# Patient Record
Sex: Male | Born: 1935 | Hispanic: No | Marital: Married | Smoking: Never smoker
Health system: Southern US, Community
[De-identification: ages and names within clinical notes are randomized; demographics above are authoritative.]

## PROBLEM LIST (undated history)

## (undated) DIAGNOSIS — I480 Paroxysmal atrial fibrillation: Secondary | ICD-10-CM

## (undated) DIAGNOSIS — G4700 Insomnia, unspecified: Secondary | ICD-10-CM

## (undated) DIAGNOSIS — I10 Essential (primary) hypertension: Secondary | ICD-10-CM

## (undated) DIAGNOSIS — F0390 Unspecified dementia without behavioral disturbance: Secondary | ICD-10-CM

## (undated) HISTORY — PX: NO PAST SURGERIES: SHX2092

---

## 2017-11-07 ENCOUNTER — Other Ambulatory Visit
Admission: RE | Admit: 2017-11-07 | Discharge: 2017-11-07 | Disposition: A | Discharge status: Home / Self Care | Payer: Medicare Other | Source: Other Acute Inpatient Hospital | Attending: Family Medicine | Admitting: Family Medicine

## 2017-11-07 DIAGNOSIS — R52 Pain, unspecified: Secondary | ICD-10-CM | POA: Diagnosis present

## 2017-11-07 LAB — URINALYSIS, COMPLETE (UACMP) WITH MICROSCOPIC
Bilirubin Urine: NEGATIVE
GLUCOSE, UA: NEGATIVE mg/dL
Hgb urine dipstick: NEGATIVE
KETONES UR: NEGATIVE mg/dL
NITRITE: POSITIVE — AB
PROTEIN: NEGATIVE mg/dL
Specific Gravity, Urine: 1.006 (ref 1.005–1.030)
Squamous Epithelial / LPF: NONE SEEN
pH: 5 (ref 5.0–8.0)

## 2017-11-09 LAB — URINE CULTURE: Culture: >=100000 — AB

## 2018-01-05 ENCOUNTER — Inpatient Hospital Stay
Admission: EM | Admit: 2018-01-05 | Discharge: 2018-01-08 | DRG: 481 | Disposition: A | Discharge status: Skilled Nursing Facility | Payer: Medicare Other | Attending: Internal Medicine | Admitting: Internal Medicine

## 2018-01-05 ENCOUNTER — Emergency Department: Payer: Medicare Other

## 2018-01-05 DIAGNOSIS — G4700 Insomnia, unspecified: Secondary | ICD-10-CM | POA: Diagnosis present

## 2018-01-05 DIAGNOSIS — W19XXXA Unspecified fall, initial encounter: Secondary | ICD-10-CM

## 2018-01-05 DIAGNOSIS — I480 Paroxysmal atrial fibrillation: Secondary | ICD-10-CM | POA: Diagnosis present

## 2018-01-05 DIAGNOSIS — I482 Chronic atrial fibrillation: Secondary | ICD-10-CM | POA: Diagnosis present

## 2018-01-05 DIAGNOSIS — S72141A Displaced intertrochanteric fracture of right femur, initial encounter for closed fracture: Secondary | ICD-10-CM | POA: Diagnosis not present

## 2018-01-05 DIAGNOSIS — T148XXA Other injury of unspecified body region, initial encounter: Secondary | ICD-10-CM

## 2018-01-05 DIAGNOSIS — Y92129 Unspecified place in nursing home as the place of occurrence of the external cause: Secondary | ICD-10-CM

## 2018-01-05 DIAGNOSIS — W010XXA Fall on same level from slipping, tripping and stumbling without subsequent striking against object, initial encounter: Secondary | ICD-10-CM | POA: Diagnosis present

## 2018-01-05 DIAGNOSIS — S72001A Fracture of unspecified part of neck of right femur, initial encounter for closed fracture: Secondary | ICD-10-CM | POA: Diagnosis present

## 2018-01-05 DIAGNOSIS — M25551 Pain in right hip: Secondary | ICD-10-CM | POA: Diagnosis not present

## 2018-01-05 DIAGNOSIS — Z66 Do not resuscitate: Secondary | ICD-10-CM | POA: Diagnosis present

## 2018-01-05 DIAGNOSIS — I10 Essential (primary) hypertension: Secondary | ICD-10-CM | POA: Diagnosis present

## 2018-01-05 DIAGNOSIS — F0390 Unspecified dementia without behavioral disturbance: Secondary | ICD-10-CM | POA: Diagnosis present

## 2018-01-05 DIAGNOSIS — Z7982 Long term (current) use of aspirin: Secondary | ICD-10-CM

## 2018-01-05 DIAGNOSIS — N390 Urinary tract infection, site not specified: Secondary | ICD-10-CM | POA: Diagnosis present

## 2018-01-05 HISTORY — DX: Essential (primary) hypertension: I10

## 2018-01-05 HISTORY — DX: Paroxysmal atrial fibrillation: I48.0

## 2018-01-05 HISTORY — DX: Unspecified dementia, unspecified severity, without behavioral disturbance, psychotic disturbance, mood disturbance, and anxiety: F03.90

## 2018-01-05 HISTORY — DX: Insomnia, unspecified: G47.00

## 2018-01-05 LAB — CBC WITH DIFFERENTIAL/PLATELET
BASOS ABS: 0.1 10*3/uL (ref 0–0.1)
BASOS PCT: 1 %
EOS ABS: 0.3 10*3/uL (ref 0–0.7)
Eosinophils Relative: 3 %
HCT: 36.1 % — ABNORMAL LOW (ref 40.0–52.0)
Hemoglobin: 11.8 g/dL — ABNORMAL LOW (ref 13.0–18.0)
Lymphocytes Relative: 17 %
Lymphs Abs: 1.5 10*3/uL (ref 1.0–3.6)
MCH: 26 pg (ref 26.0–34.0)
MCHC: 32.7 g/dL (ref 32.0–36.0)
MCV: 79.6 fL — ABNORMAL LOW (ref 80.0–100.0)
MONO ABS: 0.7 10*3/uL (ref 0.2–1.0)
MONOS PCT: 8 %
NEUTROS PCT: 71 %
Neutro Abs: 6.4 10*3/uL (ref 1.4–6.5)
Platelets: 147 10*3/uL — ABNORMAL LOW (ref 150–440)
RBC: 4.53 MIL/uL (ref 4.40–5.90)
RDW: 15.8 % — AB (ref 11.5–14.5)
WBC: 8.8 10*3/uL (ref 3.8–10.6)

## 2018-01-05 LAB — TYPE AND SCREEN
ABO/RH(D): A POS
ANTIBODY SCREEN: NEGATIVE

## 2018-01-05 LAB — COMPREHENSIVE METABOLIC PANEL
ALT: 20 U/L (ref 17–63)
ANION GAP: 10 (ref 5–15)
AST: 26 U/L (ref 15–41)
Albumin: 4 g/dL (ref 3.5–5.0)
Alkaline Phosphatase: 94 U/L (ref 38–126)
BILIRUBIN TOTAL: 0.6 mg/dL (ref 0.3–1.2)
BUN: 10 mg/dL (ref 6–20)
CO2: 25 mmol/L (ref 22–32)
CREATININE: 0.79 mg/dL (ref 0.61–1.24)
Calcium: 9.1 mg/dL (ref 8.9–10.3)
Chloride: 100 mmol/L — ABNORMAL LOW (ref 101–111)
GFR calc non Af Amer: >60 mL/min (ref >60)
GLUCOSE: 99 mg/dL (ref 65–99)
Potassium: 3.7 mmol/L (ref 3.5–5.1)
SODIUM: 135 mmol/L (ref 135–145)
TOTAL PROTEIN: 7.5 g/dL (ref 6.5–8.1)

## 2018-01-05 MED ORDER — MORPHINE SULFATE (PF) 4 MG/ML IV SOLN
4.0000 mg | Freq: Once | INTRAVENOUS | Status: AC
  Administered 2018-01-05: 4 mg via INTRAVENOUS

## 2018-01-05 MED ORDER — MORPHINE SULFATE (PF) 4 MG/ML IV SOLN
INTRAVENOUS | Status: AC
  Filled 2018-01-05: qty 1

## 2018-01-05 MED ORDER — FENTANYL CITRATE (PF) 100 MCG/2ML IJ SOLN
INTRAMUSCULAR | Status: AC
  Administered 2018-01-05: 75 ug
  Filled 2018-01-05: qty 2

## 2018-01-05 NOTE — ED Triage Notes (Signed)
Pt complains of right hip pain, no deformities or rotation or shortening noted at this time. Pt states no pain

## 2018-01-05 NOTE — ED Triage Notes (Signed)
[facility]. Pt was bending down to plug cell in to wall when he lost balance and feel over. Pt denies LOC and hitting head. Pt denies blood thinners but does take 81 mg of asprin daily. NAD VSS

## 2018-01-05 NOTE — ED Notes (Signed)
Assisted patient with urinal.

## 2018-01-05 NOTE — ED Notes (Signed)
Patient transported to MRI 

## 2018-01-05 NOTE — ED Provider Notes (Signed)
[facility] Emergency Department Provider Note    ____________________________________________   I have reviewed the triage vital signs and the nursing notes.   HISTORY  Chief Complaint Fall   History limited by: Not Limited   HPI Trevor Porter is a 83 y.o. male who presents to the emergency department today because of concern for right hip pain after a fall. The patient states that he lost balance and fell onto a tile floor. Fell onto his right side. Now has severe pain. Denies hitting his head or other injuries. Has history of left hip pain. No recent illness. No fevers. No chest pain.   History reviewed. No pertinent past medical history.  There are no active problems to display for this patient.   History reviewed. No pertinent surgical history.  Prior to Admission medications   Not on File    Allergies Patient has no allergy information on record.  No family history on file.  Social History Social History   Tobacco Use  . Smoking status: Never Smoker  . Smokeless tobacco: Never Used  Substance Use Topics  . Alcohol use: No    Frequency: Never  . Drug use: No    Review of Systems Constitutional: No fever/chills Eyes: No visual changes. ENT: No sore throat. Cardiovascular: Denies chest pain. Respiratory: Denies shortness of breath. Gastrointestinal: No abdominal pain.  No nausea, no vomiting.  No diarrhea.   Genitourinary: Negative for dysuria. Musculoskeletal: Positive for right hip pain.  Skin: Negative for rash. Neurological: Negative for headaches, focal weakness or numbness.  ____________________________________________   PHYSICAL EXAM:  VITAL SIGNS: ED Triage Vitals  Enc Vitals Group     BP 01/05/18 2110 137/72     Pulse Rate 01/05/18 2110 (!) 110     Resp --      Temp 01/05/18 2110 97.7 F (36.5 C)     Temp Source 01/05/18 2110 Oral     SpO2 01/05/18 2110 95 %     Weight 01/05/18 2110 145 lb (65.8 kg)   Height 01/05/18 2110 5\' 11"  (1.803 m)     Head Circumference --      Peak Flow --      Pain Score 01/05/18 2131 8   Constitutional: Alert and oriented. Well appearing and in no distress. Eyes: Conjunctivae are normal.  ENT   Head: Normocephalic and atraumatic.   Nose: No congestion/rhinnorhea.   Mouth/Throat: Mucous membranes are moist.   Neck: No stridor. Hematological/Lymphatic/Immunilogical: No cervical lymphadenopathy. Cardiovascular: Normal rate, regular rhythm.  No murmurs, rubs, or gallops. Respiratory: Normal respiratory effort without tachypnea nor retractions. Breath sounds are clear and equal bilaterally. No wheezes/rales/rhonchi. Gastrointestinal: Soft and non tender. No rebound. No guarding.  Genitourinary: Deferred Musculoskeletal: Extremely tender to palpation and manipulation of the right hip. Neurologic:  Normal speech and language. No gross focal neurologic deficits are appreciated.  Skin:  Skin is warm, dry and intact. No rash noted. Psychiatric: Mood and affect are normal. Speech and behavior are normal. Patient exhibits appropriate insight and judgment.  ____________________________________________    LABS (pertinent positives/negatives)  CMP wnl except chloride 100 CBC wbc 8.8, hgb 11.8, plt 147  ____________________________________________   EKG  None  ____________________________________________    RADIOLOGY  X-ray right hip/femur Concern for possible right hip fracture  MRI pending  I, Arantza Darrington, personally viewed and evaluated these images (plain radiographs) as part of my medical decision making. ____________________________________________   PROCEDURES  Procedures  ____________________________________________   INITIAL IMPRESSION / ASSESSMENT  AND PLAN / ED COURSE  Pertinent labs & imaging results that were available during my care of the patient were reviewed by me and considered in my medical decision making  (see chart for details).  Patient presented to the emergency department today because of concerns for right hip pain after a fall onto tile.  X-rays show possible fracture.  Discussed with Dr. Allena KatzPatel with orthopedics to request an MRI to further assess for fracture pathology.  ____________________________________________   FINAL CLINICAL IMPRESSION(S) / ED DIAGNOSES  Final diagnoses:  Fall  Right hip pain   Note: This dictation was prepared with Dragon dictation. Any transcriptional errors that result from this process are unintentional     Phineas SemenGoodman, Amena Dockham, MD 01/05/18 2329

## 2018-01-05 NOTE — ED Notes (Signed)
Patient transported to X-ray 

## 2018-01-06 ENCOUNTER — Inpatient Hospital Stay: Payer: Medicare Other | Admitting: Anesthesiology

## 2018-01-06 ENCOUNTER — Inpatient Hospital Stay: Payer: Medicare Other

## 2018-01-06 ENCOUNTER — Encounter: Payer: Self-pay | Admitting: Internal Medicine

## 2018-01-06 ENCOUNTER — Inpatient Hospital Stay
Admit: 2018-01-06 | Discharge: 2018-01-06 | Disposition: A | Discharge status: Home / Self Care | Payer: Medicare Other | Attending: Internal Medicine | Admitting: Internal Medicine

## 2018-01-06 ENCOUNTER — Emergency Department: Payer: Medicare Other

## 2018-01-06 ENCOUNTER — Encounter
Admission: EM | Disposition: A | Discharge status: Skilled Nursing Facility | Payer: Self-pay | Source: Home / Self Care | Attending: Internal Medicine

## 2018-01-06 DIAGNOSIS — S72001A Fracture of unspecified part of neck of right femur, initial encounter for closed fracture: Secondary | ICD-10-CM | POA: Diagnosis present

## 2018-01-06 DIAGNOSIS — W010XXA Fall on same level from slipping, tripping and stumbling without subsequent striking against object, initial encounter: Secondary | ICD-10-CM | POA: Diagnosis present

## 2018-01-06 DIAGNOSIS — I482 Chronic atrial fibrillation: Secondary | ICD-10-CM | POA: Diagnosis present

## 2018-01-06 DIAGNOSIS — I10 Essential (primary) hypertension: Secondary | ICD-10-CM | POA: Diagnosis present

## 2018-01-06 DIAGNOSIS — S72141A Displaced intertrochanteric fracture of right femur, initial encounter for closed fracture: Secondary | ICD-10-CM | POA: Diagnosis present

## 2018-01-06 DIAGNOSIS — I480 Paroxysmal atrial fibrillation: Secondary | ICD-10-CM | POA: Diagnosis present

## 2018-01-06 DIAGNOSIS — F0390 Unspecified dementia without behavioral disturbance: Secondary | ICD-10-CM | POA: Diagnosis present

## 2018-01-06 DIAGNOSIS — G4700 Insomnia, unspecified: Secondary | ICD-10-CM | POA: Diagnosis present

## 2018-01-06 DIAGNOSIS — Z66 Do not resuscitate: Secondary | ICD-10-CM | POA: Diagnosis present

## 2018-01-06 DIAGNOSIS — Z7982 Long term (current) use of aspirin: Secondary | ICD-10-CM | POA: Diagnosis not present

## 2018-01-06 DIAGNOSIS — N390 Urinary tract infection, site not specified: Secondary | ICD-10-CM | POA: Diagnosis present

## 2018-01-06 DIAGNOSIS — M25551 Pain in right hip: Secondary | ICD-10-CM | POA: Diagnosis present

## 2018-01-06 DIAGNOSIS — Y92129 Unspecified place in nursing home as the place of occurrence of the external cause: Secondary | ICD-10-CM | POA: Diagnosis not present

## 2018-01-06 HISTORY — PX: INTRAMEDULLARY (IM) NAIL INTERTROCHANTERIC: SHX5875

## 2018-01-06 LAB — SURGICAL PCR SCREEN
MRSA, PCR: NEGATIVE
STAPHYLOCOCCUS AUREUS: NEGATIVE

## 2018-01-06 LAB — MAGNESIUM: Magnesium: 1.4 mg/dL — ABNORMAL LOW (ref 1.7–2.4)

## 2018-01-06 LAB — ECHOCARDIOGRAM COMPLETE
Height: 71 in
Weight: 2320 oz

## 2018-01-06 LAB — TSH: TSH: 2.761 u[IU]/mL (ref 0.350–4.500)

## 2018-01-06 SURGERY — FIXATION, FRACTURE, INTERTROCHANTERIC, WITH INTRAMEDULLARY ROD
Anesthesia: General | Laterality: Right | Wound class: Clean

## 2018-01-06 MED ORDER — SENNOSIDES-DOCUSATE SODIUM 8.6-50 MG PO TABS
1.0000 | ORAL_TABLET | Freq: Every evening | ORAL | Status: DC | PRN
  Administered 2018-01-07: 1 via ORAL
  Filled 2018-01-06: qty 1

## 2018-01-06 MED ORDER — SODIUM CHLORIDE 0.9 % IV SOLN
INTRAVENOUS | Status: DC | PRN
  Administered 2018-01-06: 15 ug/min via INTRAVENOUS

## 2018-01-06 MED ORDER — BISACODYL 10 MG RE SUPP: 10.0000 mg | Freq: Every day | RECTAL | Status: DC | PRN

## 2018-01-06 MED ORDER — PHENYLEPHRINE HCL 10 MG/ML IJ SOLN
INTRAMUSCULAR | Status: DC | PRN
  Administered 2018-01-06: 150 ug via INTRAVENOUS
  Administered 2018-01-06: 100 ug via INTRAVENOUS

## 2018-01-06 MED ORDER — OXYCODONE HCL 5 MG PO TABS
5.0000 mg | ORAL_TABLET | ORAL | Status: DC | PRN
  Administered 2018-01-07 – 2018-01-08 (×4): 5 mg via ORAL
  Filled 2018-01-06 (×4): qty 1

## 2018-01-06 MED ORDER — AMIODARONE IV BOLUS ONLY 150 MG/100ML
150.0000 mg | Freq: Once | INTRAVENOUS | Status: AC
  Administered 2018-01-06: 150 mg via INTRAVENOUS
  Filled 2018-01-06: qty 100

## 2018-01-06 MED ORDER — NEOMYCIN-POLYMYXIN B GU 40-200000 IR SOLN
Status: DC | PRN
  Administered 2018-01-06: 300 mL

## 2018-01-06 MED ORDER — LIDOCAINE-EPINEPHRINE (PF) 1 %-1:200000 IJ SOLN
INTRAMUSCULAR | Status: AC
  Filled 2018-01-06: qty 30

## 2018-01-06 MED ORDER — LIDOCAINE-EPINEPHRINE (PF) 1 %-1:200000 IJ SOLN
INTRAMUSCULAR | Status: DC | PRN
  Administered 2018-01-06: 23 mL

## 2018-01-06 MED ORDER — TRAZODONE HCL 50 MG PO TABS
50.0000 mg | ORAL_TABLET | Freq: Every day | ORAL | Status: DC
  Administered 2018-01-06 – 2018-01-07 (×2): 50 mg via ORAL
  Filled 2018-01-06 (×2): qty 1

## 2018-01-06 MED ORDER — FENTANYL CITRATE (PF) 100 MCG/2ML IJ SOLN: 25.0000 ug | INTRAMUSCULAR | Status: DC | PRN

## 2018-01-06 MED ORDER — PHENYLEPHRINE HCL 10 MG/ML IJ SOLN
INTRAMUSCULAR | Status: AC
  Filled 2018-01-06: qty 1

## 2018-01-06 MED ORDER — CEFAZOLIN SODIUM-DEXTROSE 2-4 GM/100ML-% IV SOLN
2.0000 g | Freq: Four times a day (QID) | INTRAVENOUS | Status: AC
  Administered 2018-01-06 – 2018-01-07 (×2): 2 g via INTRAVENOUS
  Filled 2018-01-06 (×3): qty 100

## 2018-01-06 MED ORDER — FLEET ENEMA 7-19 GM/118ML RE ENEM: 1.0000 | ENEMA | Freq: Once | RECTAL | Status: DC | PRN

## 2018-01-06 MED ORDER — SODIUM CHLORIDE 0.9 % IV SOLN
1.0000 g | INTRAVENOUS | Status: DC
  Administered 2018-01-06: 1 g via INTRAVENOUS
  Filled 2018-01-06 (×2): qty 10

## 2018-01-06 MED ORDER — FENTANYL CITRATE (PF) 100 MCG/2ML IJ SOLN
INTRAMUSCULAR | Status: DC | PRN
  Administered 2018-01-06 (×2): 50 ug via INTRAVENOUS

## 2018-01-06 MED ORDER — ONDANSETRON HCL 4 MG/2ML IJ SOLN: 4.0000 mg | Freq: Once | INTRAMUSCULAR | Status: DC | PRN

## 2018-01-06 MED ORDER — SODIUM CHLORIDE 0.9 % IV SOLN
INTRAVENOUS | Status: DC
  Administered 2018-01-06: 14:00:00 via INTRAVENOUS

## 2018-01-06 MED ORDER — BUPIVACAINE-EPINEPHRINE 0.5% -1:200000 IJ SOLN
INTRAMUSCULAR | Status: DC | PRN
  Administered 2018-01-06: 23 mL

## 2018-01-06 MED ORDER — METOPROLOL TARTRATE 5 MG/5ML IV SOLN
5.0000 mg | Freq: Once | INTRAVENOUS | Status: AC
  Administered 2018-01-06: 5 mg via INTRAVENOUS

## 2018-01-06 MED ORDER — VASOPRESSIN 20 UNIT/ML IV SOLN
INTRAVENOUS | Status: DC | PRN
  Administered 2018-01-06: 1 [IU] via INTRAVENOUS
  Administered 2018-01-06: 2 [IU] via INTRAVENOUS
  Administered 2018-01-06: 1 [IU] via INTRAVENOUS
  Administered 2018-01-06: 2 [IU] via INTRAVENOUS

## 2018-01-06 MED ORDER — METOCLOPRAMIDE HCL 5 MG/ML IJ SOLN: 5.0000 mg | Freq: Three times a day (TID) | INTRAMUSCULAR | Status: DC | PRN

## 2018-01-06 MED ORDER — ENOXAPARIN SODIUM 40 MG/0.4ML ~~LOC~~ SOLN: 40.0000 mg | SUBCUTANEOUS | Status: DC

## 2018-01-06 MED ORDER — ACETAMINOPHEN 325 MG PO TABS
650.0000 mg | ORAL_TABLET | Freq: Four times a day (QID) | ORAL | Status: DC | PRN
  Administered 2018-01-06: 650 mg via ORAL
  Filled 2018-01-06: qty 2

## 2018-01-06 MED ORDER — METOPROLOL TARTRATE 5 MG/5ML IV SOLN
INTRAVENOUS | Status: AC
  Filled 2018-01-06: qty 5

## 2018-01-06 MED ORDER — PROPOFOL 10 MG/ML IV BOLUS
INTRAVENOUS | Status: AC
  Filled 2018-01-06: qty 20

## 2018-01-06 MED ORDER — ASPIRIN EC 81 MG PO TBEC
81.0000 mg | DELAYED_RELEASE_TABLET | Freq: Every day | ORAL | Status: DC
  Administered 2018-01-07 – 2018-01-08 (×2): 81 mg via ORAL
  Filled 2018-01-06 (×2): qty 1

## 2018-01-06 MED ORDER — METOCLOPRAMIDE HCL 10 MG PO TABS: 5.0000 mg | ORAL_TABLET | Freq: Three times a day (TID) | ORAL | Status: DC | PRN

## 2018-01-06 MED ORDER — METHOCARBAMOL 500 MG PO TABS
500.0000 mg | ORAL_TABLET | Freq: Four times a day (QID) | ORAL | Status: DC | PRN
  Administered 2018-01-06: 500 mg via ORAL
  Filled 2018-01-06: qty 1

## 2018-01-06 MED ORDER — VASOPRESSIN 20 UNIT/ML IV SOLN
INTRAVENOUS | Status: AC
  Filled 2018-01-06: qty 1

## 2018-01-06 MED ORDER — ONDANSETRON HCL 4 MG PO TABS: 4.0000 mg | ORAL_TABLET | Freq: Four times a day (QID) | ORAL | Status: DC | PRN

## 2018-01-06 MED ORDER — METHOCARBAMOL 1000 MG/10ML IJ SOLN
500.0000 mg | Freq: Four times a day (QID) | INTRAMUSCULAR | Status: DC | PRN
  Filled 2018-01-06: qty 5

## 2018-01-06 MED ORDER — CARVEDILOL 25 MG PO TABS
25.0000 mg | ORAL_TABLET | Freq: Two times a day (BID) | ORAL | Status: DC
  Administered 2018-01-06 – 2018-01-08 (×3): 25 mg via ORAL
  Filled 2018-01-06 (×3): qty 1

## 2018-01-06 MED ORDER — ENOXAPARIN SODIUM 40 MG/0.4ML ~~LOC~~ SOLN
40.0000 mg | SUBCUTANEOUS | Status: DC
  Administered 2018-01-07 – 2018-01-08 (×2): 40 mg via SUBCUTANEOUS
  Filled 2018-01-06 (×2): qty 0.4

## 2018-01-06 MED ORDER — ROCURONIUM BROMIDE 100 MG/10ML IV SOLN
INTRAVENOUS | Status: DC | PRN
  Administered 2018-01-06: 30 mg via INTRAVENOUS

## 2018-01-06 MED ORDER — NEOMYCIN-POLYMYXIN B GU 40-200000 IR SOLN
Status: AC
  Filled 2018-01-06: qty 4

## 2018-01-06 MED ORDER — DIPHENHYDRAMINE HCL 12.5 MG/5ML PO ELIX: 12.5000 mg | ORAL_SOLUTION | ORAL | Status: DC | PRN

## 2018-01-06 MED ORDER — FENTANYL CITRATE (PF) 250 MCG/5ML IJ SOLN
INTRAMUSCULAR | Status: AC
  Filled 2018-01-06: qty 5

## 2018-01-06 MED ORDER — PROPOFOL 10 MG/ML IV BOLUS
INTRAVENOUS | Status: DC | PRN
  Administered 2018-01-06: 130 mg via INTRAVENOUS

## 2018-01-06 MED ORDER — DILTIAZEM HCL 25 MG/5ML IV SOLN
10.0000 mg | Freq: Once | INTRAVENOUS | Status: AC
  Administered 2018-01-06: 10 mg via INTRAVENOUS
  Filled 2018-01-06: qty 5

## 2018-01-06 MED ORDER — ONDANSETRON HCL 4 MG/2ML IJ SOLN: 4.0000 mg | Freq: Four times a day (QID) | INTRAMUSCULAR | Status: DC | PRN

## 2018-01-06 MED ORDER — CARVEDILOL 12.5 MG PO TABS: 6.2500 mg | ORAL_TABLET | Freq: Two times a day (BID) | ORAL | Status: DC

## 2018-01-06 MED ORDER — LIDOCAINE HCL (CARDIAC) 20 MG/ML IV SOLN
INTRAVENOUS | Status: DC | PRN
  Administered 2018-01-06: 100 mg via INTRAVENOUS

## 2018-01-06 MED ORDER — DOCUSATE SODIUM 100 MG PO CAPS
100.0000 mg | ORAL_CAPSULE | Freq: Two times a day (BID) | ORAL | Status: DC
  Administered 2018-01-06 – 2018-01-08 (×3): 100 mg via ORAL
  Filled 2018-01-06 (×3): qty 1

## 2018-01-06 MED ORDER — CEFAZOLIN SODIUM-DEXTROSE 2-3 GM-%(50ML) IV SOLR
INTRAVENOUS | Status: DC | PRN
  Administered 2018-01-06: 2 g via INTRAVENOUS

## 2018-01-06 MED ORDER — SUGAMMADEX SODIUM 200 MG/2ML IV SOLN
INTRAVENOUS | Status: DC | PRN
  Administered 2018-01-06: 135 mg via INTRAVENOUS

## 2018-01-06 MED ORDER — BUPIVACAINE-EPINEPHRINE (PF) 0.5% -1:200000 IJ SOLN
INTRAMUSCULAR | Status: AC
  Filled 2018-01-06: qty 30

## 2018-01-06 MED ORDER — ONDANSETRON HCL 4 MG/2ML IJ SOLN
INTRAMUSCULAR | Status: AC
  Filled 2018-01-06: qty 2

## 2018-01-06 MED ORDER — SUCCINYLCHOLINE CHLORIDE 20 MG/ML IJ SOLN
INTRAMUSCULAR | Status: DC | PRN
  Administered 2018-01-06: 100 mg via INTRAVENOUS

## 2018-01-06 MED ORDER — DIGOXIN 0.25 MG/ML IJ SOLN
0.1250 mg | Freq: Once | INTRAMUSCULAR | Status: DC
  Filled 2018-01-06: qty 0.5

## 2018-01-06 MED ORDER — ACETAMINOPHEN 650 MG RE SUPP: 650.0000 mg | Freq: Four times a day (QID) | RECTAL | Status: DC | PRN

## 2018-01-06 MED ORDER — MORPHINE SULFATE (PF) 4 MG/ML IV SOLN
4.0000 mg | INTRAVENOUS | Status: DC | PRN
  Administered 2018-01-06: 4 mg via INTRAVENOUS
  Filled 2018-01-06: qty 1

## 2018-01-06 MED ORDER — ONDANSETRON HCL 4 MG/2ML IJ SOLN
INTRAMUSCULAR | Status: DC | PRN
  Administered 2018-01-06: 4 mg via INTRAVENOUS

## 2018-01-06 SURGICAL SUPPLY — 45 items
BIT DRILL AO GAMMA 4.2X340 (BIT) ×2 IMPLANT
BLADE SURG 15 STRL LF DISP TIS (BLADE) ×1 IMPLANT
BLADE SURG 15 STRL SS (BLADE) ×1
BNDG COHESIVE 6X5 TAN STRL LF (GAUZE/BANDAGES/DRESSINGS) IMPLANT
CANISTER SUCT 1200ML W/VALVE (MISCELLANEOUS) ×2 IMPLANT
CHLORAPREP W/TINT 26ML (MISCELLANEOUS) ×2 IMPLANT
DRAPE SHEET LG 3/4 BI-LAMINATE (DRAPES) IMPLANT
DRAPE SURG 17X11 SM STRL (DRAPES) ×4 IMPLANT
DRAPE U-SHAPE 47X51 STRL (DRAPES) ×2 IMPLANT
DRSG TEGADERM 4X4.75 (GAUZE/BANDAGES/DRESSINGS) IMPLANT
ELECT REM PT RETURN 9FT ADLT (ELECTROSURGICAL) ×2
ELECTRODE REM PT RTRN 9FT ADLT (ELECTROSURGICAL) ×1 IMPLANT
GAUZE SPONGE 4X4 12PLY STRL (GAUZE/BANDAGES/DRESSINGS) IMPLANT
GLOVE BIOGEL PI IND STRL 8 (GLOVE) ×1 IMPLANT
GLOVE BIOGEL PI INDICATOR 8 (GLOVE) ×1
GLOVE SURG SYN 7.5  E (GLOVE) ×3
GLOVE SURG SYN 7.5 E (GLOVE) ×3 IMPLANT
GOWN STRL REUS W/ TWL LRG LVL3 (GOWN DISPOSABLE) ×1 IMPLANT
GOWN STRL REUS W/ TWL XL LVL3 (GOWN DISPOSABLE) ×1 IMPLANT
GOWN STRL REUS W/TWL LRG LVL3 (GOWN DISPOSABLE) ×1
GOWN STRL REUS W/TWL XL LVL3 (GOWN DISPOSABLE) ×1
GUIDEROD T2 3X1000 (ROD) ×2 IMPLANT
K-WIRE  3.2X450M STR (WIRE) ×2
K-WIRE 3.2X450M STR (WIRE) ×2
KIT PATIENT CARE HANA TABLE (KITS) ×2 IMPLANT
KIT TURNOVER KIT A (KITS) ×2 IMPLANT
KWIRE 3.2X450M STR (WIRE) ×2 IMPLANT
MAT BLUE FLOOR 46X72 FLO (MISCELLANEOUS) IMPLANT
NAIL TROCH GAMMA 11X18 (Nail) ×2 IMPLANT
NEEDLE FILTER BLUNT 18X 1/2SAF (NEEDLE) ×1
NEEDLE FILTER BLUNT 18X1 1/2 (NEEDLE) ×1 IMPLANT
NEEDLE HYPO 22GX1.5 SAFETY (NEEDLE) ×2 IMPLANT
NS IRRIG 1000ML POUR BTL (IV SOLUTION) ×2 IMPLANT
PACK HIP COMPR (MISCELLANEOUS) ×2 IMPLANT
PAD ABD DERMACEA PRESS 5X9 (GAUZE/BANDAGES/DRESSINGS) IMPLANT
PENCIL ELECTRO HAND CTR (MISCELLANEOUS) ×2 IMPLANT
REAMER SHAFT BIXCUT (INSTRUMENTS) ×2 IMPLANT
SCREW LAG GAMMA 3 95MM (Screw) ×2 IMPLANT
SCREW LOCKING T2 F/T  5MMX35MM (Screw) ×1 IMPLANT
SCREW LOCKING T2 F/T 5MMX35MM (Screw) ×1 IMPLANT
STAPLER SKIN PROX 35W (STAPLE) ×2 IMPLANT
SUT VIC AB 2-0 CT2 27 (SUTURE) ×2 IMPLANT
SYR 10ML LL (SYRINGE) ×2 IMPLANT
SYR 30ML LL (SYRINGE) ×2 IMPLANT
TAPE CLOTH 3X10 WHT NS LF (GAUZE/BANDAGES/DRESSINGS) ×2 IMPLANT

## 2018-01-06 NOTE — NC FL2 (Signed)
[place] MEDICAID FL2 LEVEL OF CARE SCREENING TOOL     IDENTIFICATION  Patient Name: Trevor Porter Birthdate: [birth year] Sex: male Admission Date (Current Location): 01/05/2018  [place] Number:  Chiropodist and Address:  [facility], [address] [place], [place] [address]      Provider Number: [id number]  Attending Physician Name and Address:  Enedina Finner, MD  Relative Name and Phone Number:       Current Level of Care: Hospital Recommended Level of Care: Skilled Nursing Facility Prior Approval Number:    Date Approved/Denied:   PASRR Number: ([id number] )  Discharge Plan: SNF    Current Diagnoses: Patient Active Problem List   Diagnosis Date Noted  . Closed right hip fracture (HCC) 01/06/2018  . AF (paroxysmal atrial fibrillation) (HCC) 01/06/2018  . HTN (hypertension) 01/06/2018  . Insomnia 01/06/2018    Orientation RESPIRATION BLADDER Height & Weight     Self  O2(5 Liters Oxygen. ) Continent Weight: 145 lb (65.8 kg) Height:  5\' 11"  (180.3 cm)  BEHAVIORAL SYMPTOMS/MOOD NEUROLOGICAL BOWEL NUTRITION STATUS      Continent Diet(Diet: NPO for surgery to be advanced. )  AMBULATORY STATUS COMMUNICATION OF NEEDS Skin   Extensive Assist Verbally Surgical wounds                       Personal Care Assistance Level of Assistance  Bathing, Feeding, Dressing Bathing Assistance: Limited assistance Feeding assistance: Independent Dressing Assistance: Limited assistance     Functional Limitations Info  Sight, Hearing, Speech Sight Info: Adequate Hearing Info: Adequate Speech Info: Adequate    SPECIAL CARE FACTORS FREQUENCY  PT (By licensed PT), OT (By licensed OT)     PT Frequency: (5) OT Frequency: (5)            Contractures      Additional Factors Info  Code Status, Allergies Code Status Info: (DNR ) Allergies Info: (No Known Allergies. )           Current Medications (01/06/2018):  This is  the current hospital active medication list Current Facility-Administered Medications  Medication Dose Route Frequency Provider Last Rate Last Dose  . acetaminophen (TYLENOL) tablet 650 mg  650 mg Oral Q6H PRN Oralia Manis, MD       Or  . acetaminophen (TYLENOL) suppository 650 mg  650 mg Rectal Q6H PRN Oralia Manis, MD      . amiodarone (NEXTERONE) IV bolus only 150 mg/100 mL  150 mg Intravenous Once Dalia Heading, MD      . aspirin EC tablet 81 mg  81 mg Oral Daily Oralia Manis, MD      . carvedilol (COREG) tablet 25 mg  25 mg Oral BID WC Enedina Finner, MD      . cefTRIAXone (ROCEPHIN) 1 g in sodium chloride 0.9 % 100 mL IVPB  1 g Intravenous Q24H Enedina Finner, MD      . metoprolol tartrate (LOPRESSOR) 5 MG/5ML injection           . morphine 4 MG/ML injection 4 mg  4 mg Intravenous Q4H PRN Oralia Manis, MD   4 mg at 01/06/18 0554  . ondansetron (ZOFRAN) tablet 4 mg  4 mg Oral Q6H PRN Oralia Manis, MD       Or  . ondansetron [facility]) injection 4 mg  4 mg Intravenous Q6H PRN Oralia Manis, MD      . oxyCODONE (Oxy IR/ROXICODONE) immediate release tablet  5 mg  5 mg Oral Q4H PRN Oralia ManisWillis, David, MD      . traZODone (DESYREL) tablet 50 mg  50 mg Oral QHS Oralia ManisWillis, David, MD         Discharge Medications: Please see discharge summary for a list of discharge medications.  Relevant Imaging Results:  Relevant Lab Results:   Additional Information (SSN: [id number])  Lydie Stammen, Darleen CrockerBailey M, LCSW

## 2018-01-06 NOTE — H&P (Signed)
[facility] [place] [facility]   PATIENT NAME: Trevor Porter    MR#:  [id number]  DATE OF BIRTH:  Jun 24, 1935  DATE OF ADMISSION:  01/05/2018  PRIMARY CARE PHYSICIAN: Patient, No Pcp Per   REQUESTING/REFERRING PHYSICIAN: Manson Passey, MD  CHIEF COMPLAINT:   Chief Complaint  Patient presents with  . Fall    HISTORY OF PRESENT ILLNESS:  Trevor Porter  is a 83 y.o. male who presents with mechanical fall at home and subsequent right hip fracture.  Patient states that he fell on some tile.  Orthopedic surgery contacted and hospitalist called for admission.  PAST MEDICAL HISTORY:   Past Medical History:  Diagnosis Date  . Dementia   . HTN (hypertension)   . Insomnia   . Paroxysmal atrial fibrillation (HCC)      PAST SURGICAL HISTORY:   Past Surgical History:  Procedure Laterality Date  . NO PAST SURGERIES       SOCIAL HISTORY:   Social History   Tobacco Use  . Smoking status: Never Smoker  . Smokeless tobacco: Never Used  Substance Use Topics  . Alcohol use: No    Frequency: Never     FAMILY HISTORY:   Family History  Problem Relation Age of Onset  . Hypertension Other      DRUG ALLERGIES:  No Known Allergies  MEDICATIONS AT HOME:   Prior to Admission medications   Medication Sig Start Date End Date Taking? Authorizing Provider  acetaminophen (TYLENOL) 650 MG CR tablet Take 650 mg by mouth every 8 (eight) hours as needed for pain.   Yes [provider]  aspirin EC 81 MG tablet Take 81 mg by mouth daily.   Yes [provider]  carvedilol (COREG) 6.25 MG tablet Take 6.25 mg by mouth 2 (two) times daily with a meal.   Yes [provider]  lidocaine (LMX) 4 % cream Apply 1 application topically 2 (two) times daily as needed.   Yes [provider]  traMADol (ULTRAM) 50 MG tablet Take 50 mg by mouth 2 (two) times daily.   Yes [provider]  traZODone (DESYREL) 50 MG tablet Take 50 mg by  mouth at bedtime.   Yes [provider]    REVIEW OF SYSTEMS:  Review of Systems  Constitutional: Negative for chills, fever, malaise/fatigue and weight loss.  HENT: Negative for ear pain, hearing loss and tinnitus.   Eyes: Negative for blurred vision, double vision, pain and redness.  Respiratory: Negative for cough, hemoptysis and shortness of breath.   Cardiovascular: Negative for chest pain, palpitations, orthopnea and leg swelling.  Gastrointestinal: Negative for abdominal pain, constipation, diarrhea, nausea and vomiting.  Genitourinary: Negative for dysuria, frequency and hematuria.  Musculoskeletal: Positive for falls and joint pain (right hip). Negative for back pain and neck pain.  Skin:       No acne, rash, or lesions  Neurological: Negative for dizziness, tremors, focal weakness and weakness.  Endo/Heme/Allergies: Negative for polydipsia. Does not bruise/bleed easily.  Psychiatric/Behavioral: Negative for depression. The patient is not nervous/anxious and does not have insomnia.      VITAL SIGNS:   Vitals:   01/06/18 0100 01/06/18 0235 01/06/18 0300 01/06/18 0400  BP: (!) 146/100 126/79 131/82 135/77  Pulse: (!) 110 (!) 107 (!) 123 92  Resp: 18 20 19    Temp:      TempSrc:      SpO2: 90% 93% 94% 94%  Weight:      Height:  Wt Readings from Last 3 Encounters:  01/05/18 65.8 kg (145 lb)    PHYSICAL EXAMINATION:  Physical Exam  Vitals reviewed. Constitutional: He is oriented to person, place, and time. He appears well-developed and well-nourished. No distress.  HENT:  Head: Normocephalic and atraumatic.  Mouth/Throat: Oropharynx is clear and moist.  Eyes: Conjunctivae and EOM are normal. Pupils are equal, round, and reactive to light. No scleral icterus.  Neck: Normal range of motion. Neck supple. No JVD present. No thyromegaly present.  Cardiovascular: Normal rate, regular rhythm and intact distal pulses. Exam reveals no gallop and no friction rub.    No murmur heard. Respiratory: Effort normal and breath sounds normal. No respiratory distress. He has no wheezes. He has no rales.  GI: Soft. Bowel sounds are normal. He exhibits no distension. There is no tenderness.  Musculoskeletal: Normal range of motion. He exhibits tenderness. He exhibits no edema.  No arthritis, no gout  Lymphadenopathy:    He has no cervical adenopathy.  Neurological: He is alert and oriented to person, place, and time. No cranial nerve deficit.  No dysarthria, no aphasia  Skin: Skin is warm and dry. No rash noted. No erythema.  Psychiatric: He has a normal mood and affect. His behavior is normal. Judgment and thought content normal.    LABORATORY PANEL:   CBC Recent Labs  Lab 01/05/18 2119  WBC 8.8  HGB 11.8*  HCT 36.1*  PLT 147*   ------------------------------------------------------------------------------------------------------------------  Chemistries  Recent Labs  Lab 01/05/18 2119  NA 135  K 3.7  CL 100*  CO2 25  GLUCOSE 99  BUN 10  CREATININE 0.79  CALCIUM 9.1  AST 26  ALT 20  ALKPHOS 94  BILITOT 0.6   ------------------------------------------------------------------------------------------------------------------  Cardiac Enzymes No results for input(s): TROPONINI in the last 168 hours. ------------------------------------------------------------------------------------------------------------------  RADIOLOGY:  Mr Hip Right Wo Contrast  Result Date: 01/06/2018 CLINICAL DATA:  Acute right hip pain after fall. EXAM: MR OF THE RIGHT HIP WITHOUT CONTRAST TECHNIQUE: Multiplanar, multisequence MR imaging was performed. No intravenous contrast was administered. COMPARISON:  Radiographs from earlier on the same day. FINDINGS: Bones: Confirmation of nondisplaced intertrochanteric fracture of the right femur with additional fractures extending along the long axis of the femoral neck without displacement. There is lower lumbar  degenerative disc disease. No fracture of the included lower lumbar spine from L3 caudad. The bony pelvis appears intact. Susceptibility artifacts left femoral nail fixation of the left proximal femur. Articular cartilage and labrum Articular cartilage: Chondral thinning of both hips with subchondral cystic change of the right acetabulum compatible with osteoarthritis. Labrum:  No definite labral tear given lack of joint distention. Joint or bursal effusion Joint effusion:  Joint effusion. Bursae: Greater trochanteric bursitis bilaterally. Muscles and tendons Muscles and tendons: No acute intramuscular hemorrhage. No muscle strain. Other findings Miscellaneous:   None IMPRESSION: 1. Acute nondisplaced right intertrochanteric and femoral neck fractures. 2. Osteoarthritis of both native hips with joint space narrowing and subchondral degenerate cystic change of the right acetabulum. 3. Indwelling left femoral nail fixation of the proximal femur without complication. Electronically Signed   By: Tollie Ethavid  Kwon M.D.   On: 01/06/2018 02:28   Dg Hip Unilat  With Pelvis 2-3 Views Right  Result Date: 01/05/2018 CLINICAL DATA:  Fall with right hip pain EXAM: DG HIP (WITH OR WITHOUT PELVIS) 2-3V RIGHT COMPARISON:  None. FINDINGS: Irregular lucencies over the right femoral neck region and base of the femoral neck. No dislocation. Pubic symphysis and rami are  intact. Status post intramedullary rod and screw fixation of old left femoral fracture. SI joint degenerative change right greater than left. Suture in the pelvis. IMPRESSION: Irregular lucencies overlying the right femoral neck region are suspicious for nondisplaced fracture. CT recommended to further evaluate. Electronically Signed   By: Jasmine Pang M.D.   On: 01/05/2018 22:08   Dg Femur, Min 2 Views Right  Result Date: 01/05/2018 CLINICAL DATA:  Right leg pain EXAM: RIGHT FEMUR 2 VIEWS COMPARISON:  None. FINDINGS: Minimal joint space narrowing for age. No fracture  or dislocation. There are vascular calcifications within the right thigh. IMPRESSION: Minimal right hip osteoarthrosis for age.  No acute abnormality. Electronically Signed   By: Deatra Robinson M.D.   On: 01/05/2018 22:21    EKG:  No orders found for this or any previous visit.  IMPRESSION AND PLAN:  Principal Problem:   Closed right hip fracture [facility]) -orthopedic surgery consult, defer to their recommendations for treatment of this problem.  PRN analgesia.  Cardiac risk stratification as below, patient has no risk factors   Active Problems:   AF (paroxysmal atrial fibrillation) (HCC) -continue rate controlling medications   HTN (hypertension) -continue home meds   Insomnia -home dose trazodone nightly  Chart review performed and case discussed with ED provider. Labs, imaging and/or ECG reviewed by provider and discussed with patient/family. Management plans discussed with the patient and/or family.  DVT PROPHYLAXIS: SubQ lovenox  GI PROPHYLAXIS: None  ADMISSION STATUS: Inpatient  CODE STATUS: Full Code Status History    This patient does not have a recorded code status. Please follow your organizational policy for patients in this situation.      TOTAL TIME TAKING CARE OF THIS PATIENT: 45 minutes.   Chandra Asher FIELDING 01/06/2018, 4:36 AM  [facility]  Office  [phone]  CC: Primary care physician; Patient, No Pcp Per  Note:  This document was prepared using Dragon voice recognition software and may include unintentional dictation errors.

## 2018-01-06 NOTE — Anesthesia Procedure Notes (Signed)
Procedure Name: Intubation Date/Time: 01/06/2018 2:08 PM Performed by: Lenard SimmerKarenz, Andrew, MD Pre-anesthesia Checklist: Patient identified, Emergency Drugs available, Suction available, Patient being monitored and Timeout performed Patient Re-evaluated:Patient Re-evaluated prior to induction Oxygen Delivery Method: Circle system utilized Preoxygenation: Pre-oxygenation with 100% oxygen Induction Type: IV induction Ventilation: Mask ventilation without difficulty and Two handed mask ventilation required Laryngoscope Size: McGraph and 4 Grade View: Grade I Tube type: Oral Tube size: 7.5 mm Number of attempts: 1 Airway Equipment and Method: Stylet (Head and neck maintained in same position as patient stated was comfortable pre-operatively) Placement Confirmation: ETT inserted through vocal cords under direct vision,  positive ETCO2 and breath sounds checked- equal and bilateral

## 2018-01-06 NOTE — Progress Notes (Signed)
Patient admitted to unit with uncontrolled right hip pain. Patient admission vital signs reveal hypertension and tachycardia with oxygen saturations 85%. MD notified. Orders placed per MD. Will continue to monitor.

## 2018-01-06 NOTE — ED Notes (Signed)
All belongings transferred with patient to include shoes, clothing, jewelry, and hat.

## 2018-01-06 NOTE — Progress Notes (Signed)
Family Meeting Note  Advance Directive yes  Today a meeting took place with the daughter Corrie DandyMary fields on the phone number is [phone] Patient is unable to participate due UJ:[id number]:Lacked capacity dementia  The following were discussed:Patient's diagnosis: , Patient's progosis: Patient is admitted with right hip fracture with [facility].  He has chronic A. fib.  He has chronic advanced dementia. Spoke with patient's daughter Elberta LeatherwoodMary Fields [facility].  She wishes patient to be DNR.  She expressed patient is very unhappy with the way his life is going on at present.  She does not want any aggressive measures be carried out and want patient to be comfortable in the event something happen to him. She does DNR  Time spent during discussion: 16 minutes Enedina FinnerSona Hanah Moultry, MD

## 2018-01-06 NOTE — Anesthesia Postprocedure Evaluation (Signed)
Anesthesia Post Note  Patient: Trevor GlassingJohn Porter  Procedure(s) Performed: INTRAMEDULLARY (IM) NAIL INTERTROCHANTRIC (Right )  Patient location during evaluation: PACU Anesthesia Type: General Level of consciousness: awake and alert Pain management: pain level controlled Vital Signs Assessment: post-procedure vital signs reviewed and stable Respiratory status: spontaneous breathing, nonlabored ventilation, respiratory function stable and patient connected to nasal cannula oxygen Cardiovascular status: blood pressure returned to baseline and stable Postop Assessment: no apparent nausea or vomiting Anesthetic complications: no     Last Vitals:  Vitals:   01/06/18 1732 01/06/18 1800  BP: 106/70 123/76  Pulse: 62 100  Resp:    Temp: 37.3 C   SpO2: 91%     Last Pain:  Vitals:   01/06/18 1732  TempSrc: Oral  PainSc:                  Gaetan Spieker S

## 2018-01-06 NOTE — Progress Notes (Signed)
Personal silver ring was placed back on ring as well as silver bracelet. Daughter Corrie DandyMary was notified of pt being brought back to the room.

## 2018-01-06 NOTE — Care Management (Signed)
[facility]

## 2018-01-06 NOTE — OR Nursing (Signed)
Patient is able to state his name and birthday but varies in his answers to other questions. Consent noted to be signed by his daughter and Dr. Karlton LemonKarenz from anesthesia has contacted her and has had conversation with her concerning this patient's anesthesia.

## 2018-01-06 NOTE — Progress Notes (Signed)
Verbal order received for DNR by MD Enedina FinnerSona Patel. MD placed DNR in chart. Order placed.

## 2018-01-06 NOTE — Progress Notes (Addendum)
[facility] [place] [facility]   PATIENT NAME: Trevor Porter    MR#:  [id number]  DATE OF BIRTH:  1934-11-09  SUBJECTIVE:   [facility]  Patient is confused, oriented to person only REVIEW OF SYSTEMS:   Review of Systems  Unable to perform ROS: Dementia  Musculoskeletal: Positive for joint pain.   Tolerating Diet:npo Tolerating PT: pending  DRUG ALLERGIES:  No Known Allergies  VITALS:  Blood pressure (P) 130/85, pulse (P) 96, temperature 98.2 F (36.8 C), temperature source Oral, resp. rate (P) 18, height 5\' 11"  (1.803 m), weight 65.8 kg (145 lb), SpO2 (P) 97 %.  PHYSICAL EXAMINATION:   Physical Exam  GENERAL:  83 y.o.-year-old patient lying in the bed with no acute distress.  EYES: Pupils equal, round, reactive to light and accommodation. No scleral icterus. Extraocular muscles intact.  HEENT: Head atraumatic, normocephalic. Oropharynx and nasopharynx clear.  NECK:  Supple, no jugular venous distention. No thyroid enlargement, no tenderness.  LUNGS: Normal breath sounds bilaterally, no wheezing, rales, rhonchi. No use of accessory muscles of respiration.  CARDIOVASCULAR: S1, S2 normal. No murmurs, rubs, or gallops.  ABDOMEN: Soft, nontender, nondistended. Bowel sounds present. No organomegaly or mass.  EXTREMITIES: No cyanosis, clubbing or edema b/l.    NEUROLOGIC: grossly non focal  PSYCHIATRIC:  patient is alert and pleasant SKIN: No obvious rash, lesion, or ulcer.   LABORATORY PANEL:  CBC Recent Labs  Lab 01/05/18 2119  WBC 8.8  HGB 11.8*  HCT 36.1*  PLT 147*    Chemistries  Recent Labs  Lab 01/05/18 2119  NA 135  K 3.7  CL 100*  CO2 25  GLUCOSE 99  BUN 10  CREATININE 0.79  CALCIUM 9.1  AST 26  ALT 20  ALKPHOS 94  BILITOT 0.6   Cardiac Enzymes No results for input(s): TROPONINI in the last 168 hours. RADIOLOGY:  Mr Hip Right Wo Contrast  Result Date: 01/06/2018 CLINICAL  DATA:  Acute right hip pain after fall. EXAM: MR OF THE RIGHT HIP WITHOUT CONTRAST TECHNIQUE: Multiplanar, multisequence MR imaging was performed. No intravenous contrast was administered. COMPARISON:  Radiographs from earlier on the same day. FINDINGS: Bones: Confirmation of nondisplaced intertrochanteric fracture of the right femur with additional fractures extending along the long axis of the femoral neck without displacement. There is lower lumbar degenerative disc disease. No fracture of the included lower lumbar spine from L3 caudad. The bony pelvis appears intact. Susceptibility artifacts left femoral nail fixation of the left proximal femur. Articular cartilage and labrum Articular cartilage: Chondral thinning of both hips with subchondral cystic change of the right acetabulum compatible with osteoarthritis. Labrum:  No definite labral tear given lack of joint distention. Joint or bursal effusion Joint effusion:  Joint effusion. Bursae: Greater trochanteric bursitis bilaterally. Muscles and tendons Muscles and tendons: No acute intramuscular hemorrhage. No muscle strain. Other findings Miscellaneous:   None IMPRESSION: 1. Acute nondisplaced right intertrochanteric and femoral neck fractures. 2. Osteoarthritis of both native hips with joint space narrowing and subchondral degenerate cystic change of the right acetabulum. 3. Indwelling left femoral nail fixation of the proximal femur without complication. Electronically Signed   By: Tollie Ethavid  Kwon M.D.   On: 01/06/2018 02:28   Dg Hip Unilat  With Pelvis 2-3 Views Right  Result Date: 01/05/2018 CLINICAL DATA:  Fall with right hip pain EXAM: DG HIP (WITH OR WITHOUT PELVIS) 2-3V RIGHT COMPARISON:  None. FINDINGS: Irregular lucencies over  the right femoral neck region and base of the femoral neck. No dislocation. Pubic symphysis and rami are intact. Status post intramedullary rod and screw fixation of old left femoral fracture. SI joint degenerative change right  greater than left. Suture in the pelvis. IMPRESSION: Irregular lucencies overlying the right femoral neck region are suspicious for nondisplaced fracture. CT recommended to further evaluate. Electronically Signed   By: Jasmine Pang M.D.   On: 01/05/2018 22:08   Dg Femur, Min 2 Views Right  Result Date: 01/05/2018 CLINICAL DATA:  Right leg pain EXAM: RIGHT FEMUR 2 VIEWS COMPARISON:  None. FINDINGS: Minimal joint space narrowing for age. No fracture or dislocation. There are vascular calcifications within the right thigh. IMPRESSION: Minimal right hip osteoarthrosis for age.  No acute abnormality. Electronically Signed   By: Deatra Robinson M.D.   On: 01/05/2018 22:21   ASSESSMENT AND PLAN:   Trevor Porter  is a 83 y.o. male who presents with mechanical fall at home and subsequent right hip fracture.  Patient states that he fell on some tile   1. Acute on chronic AF (paroxysmal atrial fibrillation) with RVR -continue rate controlling medications -Patient received 2 doses of IV Cardizem 10 mg and 1 dose of metoprolol in the ER -Heart rate anywhere from 90-130s. -check Mag and TSH -Potassium normal -Cardiology consultation placed.  Discussed with Dr. Lady Gary. -Echo this morning. -on coreg---increase dose to 25 mg bid  2.  Right intertrochanteric femur fracture status post mechanical fall at [facility] -Orthopedic consultation with Dr. Allena Katz -Await cardiology evaluation by Dr. Lady Gary  -Pt is at a moderate risk for surgery--this was discussed by orthopedic surgeon with patient's daughter on the phone  3.  HTN (hypertension) -continue home meds   4. Insomnia -home dose trazodone nightly  5. UTI,recurrent Iv rocephin 1 g daily  Case discussed with Care Management/Social Worker. Management plans discussed with the patient, family and they are in agreement.  CODE STATUS: FULL code  DVT Prophylaxis: SCD/TEDS  TOTAL TIME TAKING CARE OF THIS PATIENT: 30 minutes.  >50% time spent on  counselling and coordination of care  POSSIBLE D/C IN few DAYS, DEPENDING ON CLINICAL CONDITION.  Note: This dictation was prepared with Dragon dictation along with smaller phrase technology. Any transcriptional errors that result from this process are unintentional.  Enedina Finner M.D on 01/06/2018 at 7:56 AM  Between 7am to 6pm - Pager - [phone]  After 6pm go to [email or web address] - password [email or web address]  [facility]  Office  [phone]  CC: Primary care physician; Patient, No Pcp PerPatient ID: Trevor Porter, male   DOB: November 26, 1934, 83 y.o.   MRN: [id number]

## 2018-01-06 NOTE — Anesthesia Preprocedure Evaluation (Signed)
Anesthesia Evaluation  Patient identified by MRN, date of birth, ID band Patient awake    Reviewed: Allergy & Precautions, H&P , NPO status , Patient's Chart, lab work & pertinent test results, reviewed documented beta blocker date and time   History of Anesthesia Complications Negative for: history of anesthetic complications  Airway Mallampati: II  TM Distance: >3 FB Neck ROM: full    Dental  (+) Dental Advidsory Given   Pulmonary neg shortness of breath, neg COPD, Recent URI , Residual Cough,           Cardiovascular Exercise Tolerance: Good hypertension, (-) angina(-) CAD, (-) Past MI, (-) Cardiac Stents and (-) CABG + dysrhythmias Atrial Fibrillation (-) Valvular Problems/Murmurs     Neuro/Psych neg Seizures PSYCHIATRIC DISORDERS Dementia CVA, No Residual Symptoms    GI/Hepatic negative GI ROS, Neg liver ROS,   Endo/Other  negative endocrine ROS  Renal/GU negative Renal ROS  negative genitourinary   Musculoskeletal   Abdominal   Peds  Hematology negative hematology ROS (+)   Anesthesia Other Findings Past Medical History: No date: Dementia No date: HTN (hypertension) No date: Insomnia No date: Paroxysmal atrial fibrillation (HCC)   Reproductive/Obstetrics negative OB ROS                             Anesthesia Physical Anesthesia Plan  ASA: III  Anesthesia Plan: General   Post-op Pain Management:    Induction: Intravenous  PONV Risk Score and Plan: 2 and Ondansetron and Dexamethasone  Airway Management Planned: Oral ETT  Additional Equipment:   Intra-op Plan:   Post-operative Plan: Extubation in OR  Informed Consent: I have reviewed the patients History and Physical, chart, labs and discussed the procedure including the risks, benefits and alternatives for the proposed anesthesia with the patient or authorized representative who has indicated his/her understanding and  acceptance.   Dental Advisory Given  Plan Discussed with: Anesthesiologist, CRNA and Surgeon  Anesthesia Plan Comments:         Anesthesia Quick Evaluation

## 2018-01-06 NOTE — Consult Note (Addendum)
Cardiology Consultation Note    Patient ID: Trevor Porter, MRN: [id number], DOB/AGE: [birth year] 81 y.o. Admit date: 01/05/2018   Date of Consult: 01/06/2018 Primary Physician: Patient, No Pcp Per Primary Cardiologist: none  Chief Complaint: hip pain Reason for Consultation: afib Requesting MD: Dr. Enedina Finner  HPI: Trevor Porter is a 83 y.o. male with history of apparent paroxysmal atrial fibrillation, hypertension as well as dementia.  He presented to the emergency room after suffering a mechanical fall at his place of residence at [facility].  Patient is a limited historian but denies syncope.  He states he "lost his balance".  In the emergency room he was noted to have pain in his right hip.  MR revealed acute nondisplaced right intertrochanteric and femoral neck fracture.  Patient was also noted to have atrial fibrillation with rapid ventricular response.  He is hemodynamically stable.  He denies chest pain.  He denies shortness of breath.  He was given IV metoprolol as well as IV Cardizem x2.  Rate is currently ranging between 110 and 130.  He is currently undergoing surgery later today.  He is currently on p.o. nadolol which is been increased to 25 mg twice daily.  Past Medical History:  Diagnosis Date  . Dementia   . HTN (hypertension)   . Insomnia   . Paroxysmal atrial fibrillation [facility])       Surgical History:  Past Surgical History:  Procedure Laterality Date  . NO PAST SURGERIES       Home Meds: Prior to Admission medications   Medication Sig Start Date End Date Taking? Authorizing Provider  acetaminophen (TYLENOL) 650 MG CR tablet Take 650 mg by mouth every 8 (eight) hours as needed for pain.   Yes [provider]  aspirin EC 81 MG tablet Take 81 mg by mouth daily.   Yes [provider]  carvedilol (COREG) 6.25 MG tablet Take 6.25 mg by mouth 2 (two) times daily with a meal.   Yes [provider]  lidocaine (LMX) 4 % cream Apply 1 application  topically 2 (two) times daily as needed.   Yes [provider]  traMADol (ULTRAM) 50 MG tablet Take 50 mg by mouth 2 (two) times daily.   Yes [provider]  traZODone (DESYREL) 50 MG tablet Take 50 mg by mouth at bedtime.   Yes [provider]    Inpatient Medications:  . aspirin EC  81 mg Oral Daily  . carvedilol  25 mg Oral BID WC  . digoxin  0.125 mg Intravenous Once  . metoprolol tartrate      . traZODone  50 mg Oral QHS     Allergies: No Known Allergies  Social History   Socioeconomic History  . Marital status: Unknown    Spouse name: Not on file  . Number of children: Not on file  . Years of education: Not on file  . Highest education level: Not on file  Social Needs  . Financial resource strain: Not on file  . Food insecurity - worry: Not on file  . Food insecurity - inability: Not on file  . Transportation needs - medical: Not on file  . Transportation needs - non-medical: Not on file  Occupational History  . Not on file  Tobacco Use  . Smoking status: Never Smoker  . Smokeless tobacco: Never Used  Substance and Sexual Activity  . Alcohol use: No    Frequency: Never  . Drug use: No  .  Sexual activity: No  Other Topics Concern  . Not on file  Social History Narrative  . Not on file     Family History  Problem Relation Age of Onset  . Hypertension Other      Review of Systems: A 12-system review of systems was performed and is negative except as noted in the HPI.  Labs: No results for input(s): CKTOTAL, CKMB, TROPONINI in the last 72 hours. Lab Results  Component Value Date   WBC 8.8 01/05/2018   HGB 11.8 (L) 01/05/2018   HCT 36.1 (L) 01/05/2018   MCV 79.6 (L) 01/05/2018   PLT 147 (L) 01/05/2018    Recent Labs  Lab 01/05/18 2119  NA 135  K 3.7  CL 100*  CO2 25  BUN 10  CREATININE 0.79  CALCIUM 9.1  PROT 7.5  BILITOT 0.6  ALKPHOS 94  ALT 20  AST 26  GLUCOSE 99   No results found for: CHOL, HDL, LDLCALC,  TRIG No results found for: DDIMER  Radiology/Studies:  Mr Hip Right Wo Contrast  Result Date: 01/06/2018 CLINICAL DATA:  Acute right hip pain after fall. EXAM: MR OF THE RIGHT HIP WITHOUT CONTRAST TECHNIQUE: Multiplanar, multisequence MR imaging was performed. No intravenous contrast was administered. COMPARISON:  Radiographs from earlier on the same day. FINDINGS: Bones: Confirmation of nondisplaced intertrochanteric fracture of the right femur with additional fractures extending along the long axis of the femoral neck without displacement. There is lower lumbar degenerative disc disease. No fracture of the included lower lumbar spine from L3 caudad. The bony pelvis appears intact. Susceptibility artifacts left femoral nail fixation of the left proximal femur. Articular cartilage and labrum Articular cartilage: Chondral thinning of both hips with subchondral cystic change of the right acetabulum compatible with osteoarthritis. Labrum:  No definite labral tear given lack of joint distention. Joint or bursal effusion Joint effusion:  Joint effusion. Bursae: Greater trochanteric bursitis bilaterally. Muscles and tendons Muscles and tendons: No acute intramuscular hemorrhage. No muscle strain. Other findings Miscellaneous:   None IMPRESSION: 1. Acute nondisplaced right intertrochanteric and femoral neck fractures. 2. Osteoarthritis of both native hips with joint space narrowing and subchondral degenerate cystic change of the right acetabulum. 3. Indwelling left femoral nail fixation of the proximal femur without complication. Electronically Signed   By: Tollie Ethavid  Kwon M.D.   On: 01/06/2018 02:28   Dg Hip Unilat  With Pelvis 2-3 Views Right  Result Date: 01/05/2018 CLINICAL DATA:  Fall with right hip pain EXAM: DG HIP (WITH OR WITHOUT PELVIS) 2-3V RIGHT COMPARISON:  None. FINDINGS: Irregular lucencies over the right femoral neck region and base of the femoral neck. No dislocation. Pubic symphysis and rami are  intact. Status post intramedullary rod and screw fixation of old left femoral fracture. SI joint degenerative change right greater than left. Suture in the pelvis. IMPRESSION: Irregular lucencies overlying the right femoral neck region are suspicious for nondisplaced fracture. CT recommended to further evaluate. Electronically Signed   By: Jasmine PangKim  Fujinaga M.D.   On: 01/05/2018 22:08   Dg Femur, Min 2 Views Right  Result Date: 01/05/2018 CLINICAL DATA:  Right leg pain EXAM: RIGHT FEMUR 2 VIEWS COMPARISON:  None. FINDINGS: Minimal joint space narrowing for age. No fracture or dislocation. There are vascular calcifications within the right thigh. IMPRESSION: Minimal right hip osteoarthrosis for age.  No acute abnormality. Electronically Signed   By: Deatra RobinsonKevin  Herman M.D.   On: 01/05/2018 22:21    Wt Readings from Last 3 Encounters:  01/05/18 65.8 kg (145 lb)    EKG: Atrial fibrillation with rapid ventricular response  Physical Exam:  Blood pressure (P) 130/85, pulse (P) 96, temperature 98.2 F (36.8 C), temperature source Oral, resp. rate (P) 18, height 5\' 11"  (1.803 m), weight 65.8 kg (145 lb), SpO2 (P) 97 %. Body mass index is 20.22 kg/m. General: Well developed, well nourished, in no acute distress. Head: Normocephalic, atraumatic, sclera non-icteric, no xanthomas, nares are without discharge.  Neck: Negative for carotid bruits. JVD not elevated. Lungs: Clear bilaterally to auscultation without wheezes, rales, or rhonchi. Breathing is unlabored. Heart: Irregular regular rhythm no murmurs, rubs, or gallops appreciated. Abdomen: Soft, non-tender, non-distended with normoactive bowel sounds. No hepatomegaly. No rebound/guarding. No obvious abdominal masses. Msk:  Strength and tone appear normal for age. Extremities: No clubbing or cyanosis. No edema.  Distal pedal pulses are 2+ and equal bilaterally. Neuro: Alert and oriented X 3. No facial asymmetry. No focal deficit. Moves all extremities  spontaneously. Psych:  Responds to questions appropriately with a normal affect.     Assessment and Plan  83 year old male with history of excisional atrial fibrillation who suffered a mechanical fall resulting in a right hip fracture.  Now was noted to be in atrial fibrillation rapid ventricular response.  Is hemodynamic stable.  Rate remains high after IV Cardizem bolus.  Patient's A. fib shows a rapid ventricular response.  Will try to convert to sinus rhythm with IV amiodarone bolus x1. Echo is pending.  Signed, Dalia Heading MD 01/06/2018, 8:05 AM Pager: [phone]

## 2018-01-06 NOTE — Transfer of Care (Signed)
Immediate Anesthesia Transfer of Care Note  Patient: Trevor Porter  Procedure(s) Performed: INTRAMEDULLARY (IM) NAIL INTERTROCHANTRIC (Right )  Patient Location: PACU  Anesthesia Type:General  Level of Consciousness: sedated  Airway & Oxygen Therapy: Patient connected to face mask oxygen  Post-op Assessment: Post -op Vital signs reviewed and stable  Post vital signs: stable  Last Vitals:  Vitals:   01/06/18 1340 01/06/18 1542  BP: 139/82 132/89  Pulse: (!) 108 87  Resp: 18 18  Temp: 37.1 C 36.6 C  SpO2: 94% 94%    Last Pain:  Vitals:   01/06/18 1542  TempSrc: Temporal  PainSc:          Complications: No apparent anesthesia complications

## 2018-01-06 NOTE — Progress Notes (Signed)
*  PRELIMINARY RESULTS* Echocardiogram 2D Echocardiogram has been performed.  Joanette GulaJoan M Aiyanna Awtrey 01/06/2018, 9:03 AM

## 2018-01-06 NOTE — Anesthesia Post-op Follow-up Note (Signed)
Anesthesia QCDR form completed.        

## 2018-01-06 NOTE — ED Notes (Signed)
Admitting Provider at bedside. 

## 2018-01-06 NOTE — Clinical Social Work Note (Signed)
Clinical Social Work Assessment  Patient Details  Name: Trevor Porter MRN: [id number] Date of Birth: 11-12-1934  Date of referral:  01/06/18               Reason for consult:  Other (Comment Required)(From [facility] under [employer])                Permission sought to share information with:  Chartered certified accountant granted to share information::  Yes, Verbal Permission Granted  Name::      [facility]   Agency::     Relationship::     Contact Information:     Housing/Transportation Living arrangements for the past 2 months:  [place] of Information:  Patient, Adult Children, Facility Patient Interpreter Needed:  None Criminal Activity/Legal Involvement Pertinent to Current Situation/Hospitalization:  No - Comment as needed Significant Relationships:  Adult Children Lives with:  Facility Resident Do you feel safe going back to the place where you live?  Yes Need for family participation in patient care:  Yes (Comment)  Care giving concerns:  Patient is a long term care resident at [facility] under a [place] contract.    Social Worker assessment / plan:  Holiday representative ([place]) reviewed chart and noted that patient has a hip fracture and is from [facility]. Per Neoma Laming admissions coordinator at [facility] patient is a long term care resident under a [place]. Per Neoma Laming patient can return to [facility] when stable. CSW met with patient at bedside. Patient was oriented to self only. Patient was able to verbalize that he was from [facility]. CSW contacted patient's daughter Stanton Kidney. Per Stanton Kidney she is patient's HPOA and lives in [place], [place], which is right outside of [place]. Daughter reported that patient was at the [facility] for 2 months before being placed at [facility]. Daughter reported that patient has fallen before and had surgery for a hip fracture in a hospital at [facility] and  was transferred to rehab afterwards. Daughter reported that she also tired to bring patient to her house and he lived there for 6 weeks. Daughter reported that he become too much to handle and she had to place him in a facility. Daughter voiced concerns about the care at [facility], stating that the [place] doctor ordered antibiotics and [facility] did not give them to patient until 4 days later. Daughter reported that patient is on a waiting for a [place] contract bed in a SNF in [place]. Daughter reported patient has been on that waiting list for 2 years now. Daughter reported that she is agreeable for patient to return back to [facility]. CSW provided [place] emotional support and made Neoma Laming at [facility] aware of her concerns. FL2 complete. CSW will continue to follow and assist as needed. RN case manager aware of above.   Employment status:  Disabled (Comment on whether or not currently receiving Disability), Retired Forensic scientist:  Medicare, [place] Benefit PT Recommendations:  Not assessed at this time Information / Referral to community resources:  [place]  Patient/Family's Response to care:  Patient's daughter is agreeable for patient to return to [facility].   Patient/Family's Understanding of and Emotional Response to Diagnosis, Current Treatment, and Prognosis:  Patient and his daughter were very pleasant and thanked CSW for assistance.   Emotional Assessment Appearance:  Appears stated age Attitude/Demeanor/Rapport:  Affect (typically observed):  Pleasant Orientation:  Oriented to Self, Fluctuating Orientation (Suspected and/or reported [place]) Alcohol / Substance use:  Not Applicable Psych involvement (Current and /or in the community):  No (Comment)  Discharge Needs  Concerns to be addressed:  Discharge Planning Concerns Readmission within the last 30 days:  No Current discharge risk:  Dependent with Mobility, Cognitively Impaired, Chronically  ill Barriers to Discharge:  Continued Medical Work up   [employer], Veronia Beets, LCSW 01/06/2018, 8:54 AM

## 2018-01-06 NOTE — ED Notes (Signed)
Patient transported to MRI 

## 2018-01-06 NOTE — Consult Note (Addendum)
ORTHOPAEDIC CONSULTATION  REQUESTING PHYSICIAN: Enedina Finner, MD  Chief Complaint:   R hip pain  History of Present Illness: Trevor Porter is a 83 y.o. male who lost his balance and fell on a tile floor at nursing home last night. He noted immediate onset R hip pain that is 10/10 in severity. Pain is located in R groin and anterior thigh as well as over lateral hip. Pain is worse with any attempted movement. Pain relieved only mildly with rest. X-rays and MRI in ED showed R intertrochanteric hip fracture.   History obtained from patient (has dementia), prior notes and from discussion with daughter over the phone.  Of note, he has a history of dementia and a-fib. He is not on any anticoagulation.  Past Medical History:  Diagnosis Date  . Dementia   . HTN (hypertension)   . Insomnia   . Paroxysmal atrial fibrillation [facility])    Past Surgical History:  Procedure Laterality Date  . NO PAST SURGERIES     Social History   Socioeconomic History  . Marital status: Unknown    Spouse name: None  . Number of children: None  . Years of education: None  . Highest education level: None  Social Needs  . Financial resource strain: None  . Food insecurity - worry: None  . Food insecurity - inability: None  . Transportation needs - medical: None  . Transportation needs - non-medical: None  Occupational History  . None  Tobacco Use  . Smoking status: Never Smoker  . Smokeless tobacco: Never Used  Substance and Sexual Activity  . Alcohol use: No    Frequency: Never  . Drug use: No  . Sexual activity: No  Other Topics Concern  . None  Social History Narrative  . None   Family History  Problem Relation Age of Onset  . Hypertension Other    No Known Allergies Prior to Admission medications   Medication Sig Start Date End Date Taking? Authorizing Provider  acetaminophen (TYLENOL) 650 MG CR tablet Take 650 mg by mouth  every 8 (eight) hours as needed for pain.   Yes [provider]  aspirin EC 81 MG tablet Take 81 mg by mouth daily.   Yes [provider]  carvedilol (COREG) 6.25 MG tablet Take 6.25 mg by mouth 2 (two) times daily with a meal.   Yes [provider]  lidocaine (LMX) 4 % cream Apply 1 application topically 2 (two) times daily as needed.   Yes [provider]  traMADol (ULTRAM) 50 MG tablet Take 50 mg by mouth 2 (two) times daily.   Yes [provider]  traZODone (DESYREL) 50 MG tablet Take 50 mg by mouth at bedtime.   Yes [provider]   Recent Labs    01/05/18 2119  WBC 8.8  HGB 11.8*  HCT 36.1*  PLT 147*  K 3.7  CL 100*  CO2 25  BUN 10  CREATININE 0.79  GLUCOSE 99  CALCIUM 9.1   Mr Hip Right Wo Contrast  Result Date: 01/06/2018 CLINICAL DATA:  Acute right hip pain after fall. EXAM: MR OF THE RIGHT HIP WITHOUT CONTRAST TECHNIQUE: Multiplanar, multisequence MR imaging was performed. No intravenous contrast was administered. COMPARISON:  Radiographs from earlier on the same day. FINDINGS: Bones: Confirmation of nondisplaced intertrochanteric fracture of the right femur with additional fractures extending along the long axis of the femoral neck without displacement. There is lower lumbar degenerative disc disease. No fracture of the included lower  lumbar spine from L3 caudad. The bony pelvis appears intact. Susceptibility artifacts left femoral nail fixation of the left proximal femur. Articular cartilage and labrum Articular cartilage: Chondral thinning of both hips with subchondral cystic change of the right acetabulum compatible with osteoarthritis. Labrum:  No definite labral tear given lack of joint distention. Joint or bursal effusion Joint effusion:  Joint effusion. Bursae: Greater trochanteric bursitis bilaterally. Muscles and tendons Muscles and tendons: No acute intramuscular hemorrhage. No muscle strain. Other findings  Miscellaneous:   None IMPRESSION: 1. Acute nondisplaced right intertrochanteric and femoral neck fractures. 2. Osteoarthritis of both native hips with joint space narrowing and subchondral degenerate cystic change of the right acetabulum. 3. Indwelling left femoral nail fixation of the proximal femur without complication. Electronically Signed   By: Tollie Eth M.D.   On: 01/06/2018 02:28   Dg Hip Unilat  With Pelvis 2-3 Views Right  Result Date: 01/05/2018 CLINICAL DATA:  Fall with right hip pain EXAM: DG HIP (WITH OR WITHOUT PELVIS) 2-3V RIGHT COMPARISON:  None. FINDINGS: Irregular lucencies over the right femoral neck region and base of the femoral neck. No dislocation. Pubic symphysis and rami are intact. Status post intramedullary rod and screw fixation of old left femoral fracture. SI joint degenerative change right greater than left. Suture in the pelvis. IMPRESSION: Irregular lucencies overlying the right femoral neck region are suspicious for nondisplaced fracture. CT recommended to further evaluate. Electronically Signed   By: Jasmine Pang M.D.   On: 01/05/2018 22:08   Dg Femur, Min 2 Views Right  Result Date: 01/05/2018 CLINICAL DATA:  Right leg pain EXAM: RIGHT FEMUR 2 VIEWS COMPARISON:  None. FINDINGS: Minimal joint space narrowing for age. No fracture or dislocation. There are vascular calcifications within the right thigh. IMPRESSION: Minimal right hip osteoarthrosis for age.  No acute abnormality. Electronically Signed   By: Deatra Robinson M.D.   On: 01/05/2018 22:21     Positive ROS: All other systems have been reviewed and were otherwise negative with the exception of those mentioned in the HPI and as above.  Physical Exam: BP 139/72 (BP Location: Right Arm)   Pulse 94   Temp 98.2 F (36.8 C) (Oral)   Resp 19   Ht 5\' 11"  (1.803 m)   Wt 65.8 kg (145 lb)   SpO2 95%   BMI 20.22 kg/m  General:  Alert, no acute distress. A&O x 1 to name only.  Psychiatric:  Patient is NOT  competent for consent. Normal mood.   Cardiovascular:  No pedal edema, regular rate, irregular rhythm Respiratory:  No wheezing, non-labored breathing GI:  Abdomen is soft and non-tender Skin:  No lesions in the area of chief complaint, no erythema Neurologic:  Sensation intact distally, CN grossly intact Lymphatic:  No axillary or cervical lymphadenopathy  Orthopedic Exam:  RLE: 5/5 DF/PF/EHL SILT s/s/t/sp/dp distr Foot wwp RoM too painful to attempt +axial load and logroll   X-rays:  As above: question of minimally displaced R intertrochanteric fracture  MRI R hip: confirms findings of  minimally displaced R intertrochanteric fracture  Assessment/Plan: 1. I had a lengthy discussion with the patient's daughter, Elberta Leatherwood, [phone], regarding the patient's diagnosis and treatment options. After discussion of risks, benefits, and alternatives to surgery, the daughter lected to proceed with R hip intramedullary nailing today. We discussed the higher risk of postoperative complication due to patient's dementia.  2. NPO until OR. Plan for ~1pm today.  3. Patient admitted to hospitalist service. Cardiology team also  consulted for elevated HR. Also has a UTI that is being treated.    Signa KellSunny Caiya Bettes   01/06/2018 7:39 AM

## 2018-01-06 NOTE — H&P (Signed)
No significant changes noted from admission H&P.

## 2018-01-06 NOTE — Op Note (Signed)
DATE OF SURGERY: 01/06/2018  PREOPERATIVE DIAGNOSIS: Right intertrochanteric hip fracture  POSTOPERATIVE DIAGNOSIS: Right intertrochanteric hip fracture  PROCEDURE: Intramedullary nailing of right femur with cephalomedullary device  SURGEON: Rosealee AlbeeSunny H. Ziaire Bieser, MD  ASSISTANTS: none  EBL: 100 cc  COMPONENTS:  [employer] Short Gamma Nail: 11x11480mm; 95mm lag screw; 35mm distal interlocking screw   INDICATIONS: Trevor GlassingJohn Porter is a 83 y.o. male who sustained an intertrochanteric fracture after a fall. Risks and benefits of intramedullary nailing were explained to the patient's daughter [facility]) as the patient has dementia. Risks include but are not limited to bleeding, infection, injury to tissues, nerves, vessels, periprosthetic infection, DVT/PE, malunion/nonunion, and risks of anesthesia. The patient understands these risks, has completed an informed consent, and wishes to proceed.   PROCEDURE:  The patient was brought into the operating room. After administering anesthesia, the patient was placed in the supine position on the Hana table. The uninjured leg was extended while the injured lower extremity was placed in a neutral position as the fracture was essentially non-displaced. The adequacy of bony position was verified fluoroscopically in AP and lateral projections and found to be near anatomic. The lateral aspects of the operative hip and thigh were prepped with ChloraPrep solution before being draped sterilely. Preoperative IV antibiotics were administered. A timeout was performed to verify the appropriate surgical site, patient, and procedure.   The greater trochanter was identified and an approximately 7 cm incision was made about 3 fingerbreadths above the tip of the greater trochanter. The incision was carried down through the subcutaneous tissues to expose the gluteal fascia. This was split the length of the incision, providing access to the tip of the trochanter. Under fluoroscopic guidance, a  guidewire was drilled through the tip of the trochanter into the proximal metaphysis to the level of the lesser trochanter. After verifying its position fluoroscopically in AP and lateral projections, it was overreamed with the opening reamer to the level of the lesser trochanter. A guidewire was passed down through the femoral canal. The adequacy of guidewire position was verified fluoroscopically in AP and lateral projections. The guidewire was overreamed sequentially using the flexible reamers, beginning with an 11 mm reamer and progressing to a 13 mm reamer. The [employer] Short Gamma Nail was advanced to the appropriate depth as verified fluoroscopically.   The guide system for the lag screw was positioned and advanced through an approximately 4 cm stab incision over the lateral aspect of the proximal femur. The guidewire was drilled up through the femoral nail and into the femoral neck to rest within 5 mm of subchondral bone. After verifying its position in the femoral neck and head in both AP and lateral projections, the guidewire was measured and found to be optimally replicated by a 95 mm lag screw. The guidewire was overreamed to the appropriate depth before the lag screw was inserted and advanced to the appropriate depth as verified fluoroscopically in AP and lateral projections. The set screw was tightened and then untightened a quarter turn to allow for compression. Again, the adequacy of hardware position and fracture reduction was verified fluoroscopically in AP and lateral projections and found to be excellent.  Attention was directed distally and a 3cm incision was made for the distal interlocking screw. The aiming arm was used to drill and a 35mm screw was selected and advanced. There was excellent purchase distally. Adequacy of screw position was verified fluoroscopically in AP and lateral projections and found to be excellent.  The wounds were irrigated thoroughly  with sterile saline  solution. The subcutaneous tissues were closed using 2-0 Vicryl interrupted sutures. The skin was closed using staples. A mixture of sensoricaine and lidocaine was injected into the wounds. Sterile occlusive dressings were applied to all wounds. The patient was then transferred to the recovery room in satisfactory condition after tolerating the procedure well.  POSTOPERATIVE PLAN: The patient will be WBAT on the operative extremity. Lovenox 40mg /day x 4 weeks to start on POD#1. Ancef x 24 hours. PT/OT on POD#1.

## 2018-01-06 NOTE — Progress Notes (Signed)
Patient Name: Trevor Porter Date of Encounter: 01/06/2018  Hospital Problem List     Principal Problem:   Closed right hip fracture [facility]) Active Problems:   AF (paroxysmal atrial fibrillation) (HCC)   HTN (hypertension)   Insomnia    Patient Profile     83 yo with paroxysmal afib  admitted after a mechanical fall suffering right hip fracture. Noted to be in afib with rvr  Subjective   No chest pain or sob. Right hip pain  Inpatient Medications    . aspirin EC  81 mg Oral Daily  . carvedilol  25 mg Oral BID WC  . metoprolol tartrate      . traZODone  50 mg Oral QHS    Vital Signs    Vitals:   01/06/18 0852 01/06/18 0857 01/06/18 0901 01/06/18 0905  BP: (!) 135/105 120/80 (!) 143/83 133/76  Pulse: (!) 143 (!) 121 (!) 103 (!) 111  Resp:      Temp:      TempSrc:      SpO2:      Weight:      Height:        Intake/Output Summary (Last 24 hours) at 01/06/2018 1015 Last data filed at 01/06/2018 1002 Gross per 24 hour  Intake -  Output 100 ml  Net -100 ml   Filed Weights   01/05/18 2110  Weight: 65.8 kg (145 lb)    Physical Exam    GEN: Well nourished, well developed, in no acute distress.  HEENT: normal.  Neck: Supple, no JVD, carotid bruits, or masses. Cardiac:rapid rate, irr, irr, no murmurs, rubs, or gallops. No clubbing, cyanosis, edema.  Radials/DP/PT 2+ and equal bilaterally.  Respiratory:  Respirations regular and unlabored, clear to auscultation bilaterally. GI: Soft, nontender, nondistended, BS + x 4. MS: no deformity or atrophy. Skin: warm and dry, no rash. Neuro:  Strength and sensation are intact. Psych: Normal affect.  Labs    CBC Recent Labs    01/05/18 2119  WBC 8.8  NEUTROABS 6.4  HGB 11.8*  HCT 36.1*  MCV 79.6*  PLT 147*   Basic Metabolic Panel Recent Labs    16/10/96 2119 01/06/18 0915  NA 135  --   K 3.7  --   CL 100*  --   CO2 25  --   GLUCOSE 99  --   BUN 10  --   CREATININE 0.79  --   CALCIUM 9.1  --   MG  --   1.4*   Liver Function Tests Recent Labs    01/05/18 2119  AST 26  ALT 20  ALKPHOS 94  BILITOT 0.6  PROT 7.5  ALBUMIN 4.0   No results for input(s): LIPASE, AMYLASE in the last 72 hours. Cardiac Enzymes No results for input(s): CKTOTAL, CKMB, CKMBINDEX, TROPONINI in the last 72 hours. BNP No results for input(s): BNP in the last 72 hours. D-Dimer No results for input(s): DDIMER in the last 72 hours. Hemoglobin A1C No results for input(s): HGBA1C in the last 72 hours. Fasting Lipid Panel No results for input(s): CHOL, HDL, LDLCALC, TRIG, CHOLHDL, LDLDIRECT in the last 72 hours. Thyroid Function Tests Recent Labs    01/06/18 0915  TSH 2.761    Telemetry    afib with rvr  ECG    afib with rvr  Radiology    Mr Hip Right Wo Contrast  Result Date: 01/06/2018 CLINICAL DATA:  Acute right hip pain after fall. EXAM: MR OF THE RIGHT  HIP WITHOUT CONTRAST TECHNIQUE: Multiplanar, multisequence MR imaging was performed. No intravenous contrast was administered. COMPARISON:  Radiographs from earlier on the same day. FINDINGS: Bones: Confirmation of nondisplaced intertrochanteric fracture of the right femur with additional fractures extending along the long axis of the femoral neck without displacement. There is lower lumbar degenerative disc disease. No fracture of the included lower lumbar spine from L3 caudad. The bony pelvis appears intact. Susceptibility artifacts left femoral nail fixation of the left proximal femur. Articular cartilage and labrum Articular cartilage: Chondral thinning of both hips with subchondral cystic change of the right acetabulum compatible with osteoarthritis. Labrum:  No definite labral tear given lack of joint distention. Joint or bursal effusion Joint effusion:  Joint effusion. Bursae: Greater trochanteric bursitis bilaterally. Muscles and tendons Muscles and tendons: No acute intramuscular hemorrhage. No muscle strain. Other findings Miscellaneous:   None  IMPRESSION: 1. Acute nondisplaced right intertrochanteric and femoral neck fractures. 2. Osteoarthritis of both native hips with joint space narrowing and subchondral degenerate cystic change of the right acetabulum. 3. Indwelling left femoral nail fixation of the proximal femur without complication. Electronically Signed   By: Tollie Ethavid  Kwon M.D.   On: 01/06/2018 02:28   Dg Hip Unilat  With Pelvis 2-3 Views Right  Result Date: 01/05/2018 CLINICAL DATA:  Fall with right hip pain EXAM: DG HIP (WITH OR WITHOUT PELVIS) 2-3V RIGHT COMPARISON:  None. FINDINGS: Irregular lucencies over the right femoral neck region and base of the femoral neck. No dislocation. Pubic symphysis and rami are intact. Status post intramedullary rod and screw fixation of old left femoral fracture. SI joint degenerative change right greater than left. Suture in the pelvis. IMPRESSION: Irregular lucencies overlying the right femoral neck region are suspicious for nondisplaced fracture. CT recommended to further evaluate. Electronically Signed   By: Jasmine PangKim  Fujinaga M.D.   On: 01/05/2018 22:08   Dg Femur, Min 2 Views Right  Result Date: 01/05/2018 CLINICAL DATA:  Right leg pain EXAM: RIGHT FEMUR 2 VIEWS COMPARISON:  None. FINDINGS: Minimal joint space narrowing for age. No fracture or dislocation. There are vascular calcifications within the right thigh. IMPRESSION: Minimal right hip osteoarthrosis for age.  No acute abnormality. Electronically Signed   By: Deatra RobinsonKevin  Herman M.D.   On: 01/05/2018 22:21    Assessment & Plan    afib with rvr-rate is improved somewhat with iv cardizem bolus. Have also given iv amiodarone bolus. Echo shows near normal lv function with ef of 45-50%. Would proceed with surgery without further cardiac workup. Rate is improved somewhat. Would difer any further av nodal drugs for now and follow rate intra and post op.   Signed, Darlin PriestlyKenneth A. Eliah Ozawa MD 01/06/2018, 10:15 AM  Pager: [phone]

## 2018-01-07 ENCOUNTER — Encounter: Payer: Self-pay | Admitting: Orthopedic Surgery

## 2018-01-07 MED ORDER — CEPHALEXIN 500 MG PO CAPS
500.0000 mg | ORAL_CAPSULE | Freq: Two times a day (BID) | ORAL | Status: DC
  Administered 2018-01-07 – 2018-01-08 (×3): 500 mg via ORAL
  Filled 2018-01-07 (×3): qty 1

## 2018-01-07 MED ORDER — MAGNESIUM SULFATE 4 GM/100ML IV SOLN
4.0000 g | Freq: Once | INTRAVENOUS | Status: AC
  Administered 2018-01-07: 4 g via INTRAVENOUS
  Filled 2018-01-07: qty 100

## 2018-01-07 NOTE — Discharge Instructions (Signed)
INSTRUCTIONS AFTER Surgery  o Remove items at home which could result in a fall. This includes throw rugs or furniture in walking pathways o ICE to the affected joint every three hours while awake for 30 minutes at a time, for at least the first 3-5 days, and then as needed for pain and swelling.  Continue to use ice for pain and swelling. You may notice swelling that will progress down to the foot and ankle.  This is normal after surgery.  Elevate your leg when you are not up walking on it.   o Continue to use the breathing machine you got in the hospital (incentive spirometer) which will help keep your temperature down.  It is common for your temperature to cycle up and down following surgery, especially at night when you are not up moving around and exerting yourself.  The breathing machine keeps your lungs expanded and your temperature down.   DIET:  As you were doing prior to hospitalization, we recommend a well-balanced diet.  DRESSING / WOUND CARE / SHOWERING  The bandages are waterproof.  Change the bandage as necessary.  Staples will be removed at 2 weeks at the orthopedic office.  ACTIVITY  o Increase activity slowly as tolerated, but follow the weight bearing instructions below.   o No driving for 6 weeks or until further direction given by your physician.  You cannot drive while taking narcotics.  o No lifting or carrying greater than 10 lbs. until further directed by your surgeon. o Avoid periods of inactivity such as sitting longer than an hour when not asleep. This helps prevent blood clots.  o You may return to work once you are authorized by your doctor.     WEIGHT BEARING  Weightbearing as tolerated to the right leg   EXERCISES Ambulation and gait training with range of motion and strengthening.  Physical therapy and Occupational Therapy.  CONSTIPATION  Constipation is defined medically as fewer than three stools per week and severe constipation as less than one  stool per week.  Even if you have a regular bowel pattern at home, your normal regimen is likely to be disrupted due to multiple reasons following surgery.  Combination of anesthesia, postoperative narcotics, change in appetite and fluid intake all can affect your bowels.   YOU MUST use at least one of the following options; they are listed in order of increasing strength to get the job done.  They are all available over the counter, and you may need to use some, POSSIBLY even all of these options:    Drink plenty of fluids (prune juice may be helpful) and high fiber foods Colace 100 mg by mouth twice a day  Senokot for constipation as directed and as needed Dulcolax (bisacodyl), take with full glass of water  Miralax (polyethylene glycol) once or twice a day as needed.  If you have tried all these things and are unable to have a bowel movement in the first 3-4 days after surgery call either your surgeon or your primary doctor.    If you experience loose stools or diarrhea, hold the medications until you stool forms back up.  If your symptoms do not get better within 1 week or if they get worse, check with your doctor.  If you experience "the worst abdominal pain ever" or develop nausea or vomiting, please contact the office immediately for further recommendations for treatment.   ITCHING:  If you experience itching with your medications, try taking only a  single pain pill, or even half a pain pill at a time.  You can also use Benadryl over the counter for itching or also to help with sleep.   TED HOSE STOCKINGS:  Use stockings on both legs until for at least 2 weeks or as directed by physician office. They may be removed at night for sleeping.  MEDICATIONS:  See your medication summary on the After Visit Summary that nursing will review with you.  You may have some home medications which will be placed on hold until you complete the course of blood thinner medication.  It is important for you to  complete the blood thinner medication as prescribed.  PRECAUTIONS:  If you experience chest pain or shortness of breath - call 911 immediately for transfer to the hospital emergency department.   If you develop a fever greater that 101 F, purulent drainage from wound, increased redness or drainage from wound, foul odor from the wound/dressing, or calf pain - CONTACT YOUR SURGEON.                                                   FOLLOW-UP APPOINTMENTS:  If you do not already have a post-op appointment, please call the office for an appointment to be seen by your surgeon.  Guidelines for how soon to be seen are listed in your After Visit Summary, but are typically between 1-4 weeks after surgery.  OTHER INSTRUCTIONS:     MAKE SURE YOU:   Understand these instructions.   Get help right away if you are not doing well or get worse.    Thank you for letting us be a part of your medical care team.  It is a privilege we respect greatly.  We hope these instructions will help you stay on track for a fast and full recovery!

## 2018-01-07 NOTE — Evaluation (Signed)
Physical Therapy Evaluation Patient Details Name: Trevor GlassingJohn Porter MRN: [id number] DOB: June 20, 1935 Today's Date: 01/07/2018   History of Present Illness  83 y/o male who fell suffering a R hip fx and ultimately need ORIF.  Clinical Impression  Pt pleasantly confused t/o the session, he showed good effort with light supine exercises apart from the exam (~10 minutes) but struggled with all aspects of functional mobility.  He needed extremely heavy assist to get to standing and clearly was unable to comfortably take weight through R LE and essentially needed total assist to get to the recliner as actual stepping was too difficult for him.      Follow Up Recommendations SNF    Equipment Recommendations       Recommendations for Other Services       Precautions / Restrictions Precautions Precautions: Fall Restrictions Weight Bearing Restrictions: Yes RLE Weight Bearing: Weight bearing as tolerated      Mobility  Bed Mobility Overal bed mobility: Needs Assistance Bed Mobility: Supine to Sit     Supine to sit: Max assist     General bed mobility comments: Pt hesitant to get to sitting, a lot of pain with any movement.  Needed heavy assist  Transfers Overall transfer level: Needs assistance Equipment used: Rolling walker (2 wheeled) Transfers: Sit to/from Stand Sit to Stand: Max assist         General transfer comment: Pt able to give some minimal assist, but ultimately needed excessive assist to get to standing  Ambulation/Gait             General Gait Details: Pt essentially unable to tolerate weight on R, struggled to even heel-toe shuffle to side, heavy assist from PT to get to recliner  Stairs            Wheelchair Mobility    Modified Rankin (Stroke Patients Only)       Balance Overall balance assessment: Needs assistance Sitting-balance support: Bilateral upper extremity supported Sitting balance-Leahy Scale: Fair     Standing balance support:  Bilateral upper extremity supported Standing balance-Leahy Scale: Zero                               Pertinent Vitals/Pain Pain Assessment: Faces Faces Pain Scale: Hurts whole lot    Home Living Family/patient expects to be discharged to:: Skilled nursing facility                      Prior Function           Comments: unable to gather a lot of history secondary to confusion     Hand Dominance        Extremity/Trunk Assessment   Upper Extremity Assessment Upper Extremity Assessment: Generalized weakness    Lower Extremity Assessment Lower Extremity Assessment: Generalized weakness(very pain limited with any R LE movement)       Communication   Communication: No difficulties  Cognition Arousal/Alertness: Awake/alert Behavior During Therapy: WFL for tasks assessed/performed Overall Cognitive Status: Within Functional Limits for tasks assessed                                        General Comments      Exercises General Exercises - Lower Extremity Ankle Circles/Pumps: AROM;AAROM;10 reps Short Arc Quad: AAROM;10 reps Heel Slides: AAROM;10 reps Hip ABduction/ADduction: AROM;10  reps Straight Leg Raises: PROM;5 reps   Assessment/Plan    PT Assessment Patient needs continued PT services  PT Problem List Decreased strength;Decreased range of motion;Decreased activity tolerance;Decreased balance;Decreased mobility;Decreased knowledge of use of DME;Decreased safety awareness;Pain;Decreased coordination       PT Treatment Interventions DME instruction;Gait training;Functional mobility training;Therapeutic activities;Therapeutic exercise;Balance training    PT Goals (Current goals can be found in the Care Plan section)  Acute Rehab PT Goals Patient Stated Goal: unable to state PT Goal Formulation: Patient unable to participate in goal setting Time For Goal Achievement: 01/21/18 Potential to Achieve Goals: Fair     Frequency BID   Barriers to discharge        Co-evaluation               AM-PAC PT "6 Clicks" Daily Activity  Outcome Measure Difficulty turning over in bed (including adjusting bedclothes, sheets and blankets)?: Unable Difficulty moving from lying on back to sitting on the side of the bed? : Unable Difficulty sitting down on and standing up from a chair with arms (e.g., wheelchair, bedside commode, etc,.)?: Unable Help needed moving to and from a bed to chair (including a wheelchair)?: Total Help needed walking in hospital room?: Total Help needed climbing 3-5 steps with a railing? : Total 6 Click Score: 6    End of Session Equipment Utilized During Treatment: Gait belt Activity Tolerance: Patient limited by pain;Patient limited by fatigue Patient left: with chair alarm set;with call bell/phone within reach   PT Visit Diagnosis: Difficulty in walking, not elsewhere classified (R26.2);Muscle weakness (generalized) (M62.81);Pain Pain - Right/Left: Right Pain - part of body: Hip    Time: 1610-9604 PT Time Calculation (min) (ACUTE ONLY): 25 min   Charges:   PT Evaluation $PT Eval Low Complexity: 1 Low PT Treatments $Therapeutic Exercise: 8-22 mins   PT G Codes:        Malachi Pro, DPT 01/07/2018, 12:56 PM

## 2018-01-07 NOTE — Progress Notes (Signed)
  Subjective: 1 Day Post-Op Procedure(s) (LRB): INTRAMEDULLARY (IM) NAIL INTERTROCHANTRIC (Right) Patient reports pain as mild.   Patient seen in rounds with Dr. Allena KatzPatel. Patient is well, and has had no acute complaints or problems Plan is to go Rehab after hospital stay. Negative for chest pain and shortness of breath Fever: no Gastrointestinal:negative for nausea and vomiting  Objective: Vital signs in last 24 hours: Temp:  [97.8 F (36.6 C)-100.1 F (37.8 C)] 99.4 F (37.4 C) (03/21 0058) Pulse Rate:  [62-143] 93 (03/21 0058) Resp:  [16-20] 16 (03/21 0058) BP: (102-143)/(64-105) 102/64 (03/21 0058) SpO2:  [91 %-97 %] 92 % (03/21 0058)  Intake/Output from previous day:  Intake/Output Summary (Last 24 hours) at 01/07/2018 0642 Last data filed at 01/07/2018 0430 Gross per 24 hour  Intake 50 ml  Output 870 ml  Net -820 ml    Intake/Output this shift: Total I/O In: -  Out: 300 [Urine:300]  Labs: Recent Labs    01/05/18 2119  HGB 11.8*   Recent Labs    01/05/18 2119  WBC 8.8  RBC 4.53  HCT 36.1*  PLT 147*   Recent Labs    01/05/18 2119  NA 135  K 3.7  CL 100*  CO2 25  BUN 10  CREATININE 0.79  GLUCOSE 99  CALCIUM 9.1   No results for input(s): LABPT, INR in the last 72 hours.   EXAM General - Patient is Confused Extremity - Intact pulses distally Dorsiflexion/Plantar flexion intact No cellulitis present Compartment soft Dressing/Incision - clean, dry, no drainage Motor Function - intact, moving foot and toes well on exam.   Past Medical History:  Diagnosis Date  . Dementia   . HTN (hypertension)   . Insomnia   . Paroxysmal atrial fibrillation (HCC)     Assessment/Plan: 1 Day Post-Op Procedure(s) (LRB): INTRAMEDULLARY (IM) NAIL INTERTROCHANTRIC (Right) Principal Problem:   Closed right hip fracture (HCC) Active Problems:   AF (paroxysmal atrial fibrillation) (HCC)   HTN (hypertension)   Insomnia  Estimated body mass index is 20.22  kg/m as calculated from the following:   Height as of this encounter: 5\' 11"  (1.803 m).   Weight as of this encounter: 65.8 kg (145 lb). Advance diet Up with therapy D/C IV fluids Discharge to SNF when cleared medically  DVT Prophylaxis - Lovenox, Foot Pumps and TED hose Weight-Bearing as tolerated to right leg  Dedra Skeensodd Rayette Mogg, PA-C Orthopaedic Surgery 01/07/2018, 6:42 AM

## 2018-01-07 NOTE — Progress Notes (Signed)
[facility] - [place] [facility]   PATIENT NAME: Trevor Porter    MR#:  [id number]  DATE OF BIRTH:  1935/08/04  SUBJECTIVE:   Came in after mechanical fall at [facility].  Patient is confused, oriented to person only .  Postop day 1 eating breakfast REVIEW OF SYSTEMS:   Review of Systems  Unable to perform ROS: Dementia  Musculoskeletal: Positive for joint pain.   Tolerating Diet: Yes Tolerating PT: pending  DRUG ALLERGIES:  No Known Allergies  VITALS:  Blood pressure (!) 96/49, pulse (!) 101, temperature 99.4 F (37.4 C), temperature source Oral, resp. rate 16, height 5\' 11"  (1.803 m), weight 65.8 kg (145 lb), SpO2 95 %.  PHYSICAL EXAMINATION:   Physical Exam  GENERAL:  84 y.o.-year-old patient lying in the bed with no acute distress.  EYES: Pupils equal, round, reactive to light and accommodation. No scleral icterus. Extraocular muscles intact.  HEENT: Head atraumatic, normocephalic. Oropharynx and nasopharynx clear.  NECK:  Supple, no jugular venous distention. No thyroid enlargement, no tenderness.  LUNGS: Normal breath sounds bilaterally, no wheezing, rales, rhonchi. No use of accessory muscles of respiration.  CARDIOVASCULAR: S1, S2 normal. No murmurs, rubs, or gallops.  ABDOMEN: Soft, nontender, nondistended. Bowel sounds present. No organomegaly or mass.  EXTREMITIES: No cyanosis, clubbing or edema b/l.    NEUROLOGIC: grossly non focal  PSYCHIATRIC:  patient is alert and pleasant SKIN: No obvious rash, lesion, or ulcer.   LABORATORY PANEL:  CBC Recent Labs  Lab 01/05/18 2119  WBC 8.8  HGB 11.8*  HCT 36.1*  PLT 147*    Chemistries  Recent Labs  Lab 01/05/18 2119 01/06/18 0915  NA 135  --   K 3.7  --   CL 100*  --   CO2 25  --   GLUCOSE 99  --   BUN 10  --   CREATININE 0.79  --   CALCIUM 9.1  --   MG  --  1.4*  AST 26  --   ALT 20  --   ALKPHOS 94  --   BILITOT 0.6  --    Cardiac Enzymes No results for  input(s): TROPONINI in the last 168 hours. RADIOLOGY:  Mr Hip Right Wo Contrast  Result Date: 01/06/2018 CLINICAL DATA:  Acute right hip pain after fall. EXAM: MR OF THE RIGHT HIP WITHOUT CONTRAST TECHNIQUE: Multiplanar, multisequence MR imaging was performed. No intravenous contrast was administered. COMPARISON:  Radiographs from earlier on the same day. FINDINGS: Bones: Confirmation of nondisplaced intertrochanteric fracture of the right femur with additional fractures extending along the long axis of the femoral neck without displacement. There is lower lumbar degenerative disc disease. No fracture of the included lower lumbar spine from L3 caudad. The bony pelvis appears intact. Susceptibility artifacts left femoral nail fixation of the left proximal femur. Articular cartilage and labrum Articular cartilage: Chondral thinning of both hips with subchondral cystic change of the right acetabulum compatible with osteoarthritis. Labrum:  No definite labral tear given lack of joint distention. Joint or bursal effusion Joint effusion:  Joint effusion. Bursae: Greater trochanteric bursitis bilaterally. Muscles and tendons Muscles and tendons: No acute intramuscular hemorrhage. No muscle strain. Other findings Miscellaneous:   None IMPRESSION: 1. Acute nondisplaced right intertrochanteric and femoral neck fractures. 2. Osteoarthritis of both native hips with joint space narrowing and subchondral degenerate cystic change of the right acetabulum. 3. Indwelling left femoral nail fixation of the proximal femur without complication. Electronically Signed  By: Tollie Ethavid  Kwon M.D.   On: 01/06/2018 02:28   Dg Hip Operative Unilat W Or W/o Pelvis Right  Result Date: 01/06/2018 CLINICAL DATA:  Right hip fixation EXAM: OPERATIVE RIGHT HIP WITH PELVIS COMPARISON:  01/05/2018 FLUOROSCOPY TIME:  Radiation Exposure Index (as provided by the fluoroscopic device): 4.37 mGy If the device does not provide the exposure index:  Fluoroscopy Time:  59 seconds Number of Acquired Images:  5 FINDINGS: Medullary rod is noted in the proximal femur. Fixation screws noted traversing the femoral neck. The fracture fragments are in near anatomic alignment. IMPRESSION: ORIF of proximal right femoral fracture. Electronically Signed   By: Alcide CleverMark  Lukens M.D.   On: 01/06/2018 16:09   Dg Hip Unilat  With Pelvis 2-3 Views Right  Result Date: 01/05/2018 CLINICAL DATA:  Fall with right hip pain EXAM: DG HIP (WITH OR WITHOUT PELVIS) 2-3V RIGHT COMPARISON:  None. FINDINGS: Irregular lucencies over the right femoral neck region and base of the femoral neck. No dislocation. Pubic symphysis and rami are intact. Status post intramedullary rod and screw fixation of old left femoral fracture. SI joint degenerative change right greater than left. Suture in the pelvis. IMPRESSION: Irregular lucencies overlying the right femoral neck region are suspicious for nondisplaced fracture. CT recommended to further evaluate. Electronically Signed   By: Jasmine PangKim  Fujinaga M.D.   On: 01/05/2018 22:08   Dg Femur, Min 2 Views Right  Result Date: 01/05/2018 CLINICAL DATA:  Right leg pain EXAM: RIGHT FEMUR 2 VIEWS COMPARISON:  None. FINDINGS: Minimal joint space narrowing for age. No fracture or dislocation. There are vascular calcifications within the right thigh. IMPRESSION: Minimal right hip osteoarthrosis for age.  No acute abnormality. Electronically Signed   By: Deatra RobinsonKevin  Herman M.D.   On: 01/05/2018 22:21   ASSESSMENT AND PLAN:   Trevor Porter  is a 83 y.o. male who presents with mechanical fall at home and subsequent right hip fracture.  Patient states that he fell on some tile   1. Acute on chronic AF (paroxysmal atrial fibrillation) with RVR -continue rate controlling medications --Heart rate anywhere from 90-100 -Replace mag -Potassium normal -Cardiology consultation placed.  Discussed with Dr. Lady GaryFath. -Echo shows EF of 45% -on coreg---increase dose to 25 mg  bid  2.  Right intertrochanteric femur fracture [facility] -Orthopedic consultation with Dr. Allena KatzPatel appreciated -Patient is postop day 1.  Physical therapy to be initiated  -Child psychotherapistocial worker for discharge planning  3.  HTN (hypertension) -continue home meds   4. Insomnia -home dose trazodone nightly  5. UTI,recurrent Iv rocephin 1 g daily--- changed to Keflex  Case discussed with Care Management/Social Worker. Management plans discussed with the patient, family and they are in agreement.  CODE STATUS: DNR  DVT Prophylaxis: Lovenox  TOTAL TIME TAKING CARE OF THIS PATIENT: 30 minutes.  >50% time spent on counselling and coordination of care  POSSIBLE D/C IN few DAYS, DEPENDING ON CLINICAL CONDITION.  Note: This dictation was prepared with Dragon dictation along with smaller phrase technology. Any transcriptional errors that result from this process are unintentional.  Enedina FinnerSona Jozee Hammer M.D on 01/07/2018 at 11:39 AM  Between 7am to 6pm - Pager - [phone]  After 6pm go to [email or web address] - password [email or web address]  [facility]  Office  [phone]  CC: Primary care physician; Patient, No Pcp PerPatient ID: Trevor Porter, male   DOB: April 11, 1935, 83 y.o.   MRN: [id number]

## 2018-01-07 NOTE — Progress Notes (Signed)
Physical Therapy Treatment Patient Details Name: Trevor Porter MRN: [id number] DOB: [birth year] Today's Date: 01/07/2018    History of Present Illness 83 y/o male who fell suffering a R hip fx and ultimately need ORIF.    PT Comments    Pt agreeable to PT; mod A RLE with movement/stepping (also points to L groin, when asked about pain). Pt demonstrating overall improvement with mobility tasks. Requires assist overall with Right lower extremity movement with most exercises. Mod A to stand initially improving to Min A to complete full stand with increased time and cues. Mod A for stepping chair to bed and again increased verbal and tactile cues, but no overt LOB maintaining Mod A for balance/support and assist to maneuver rolling walker. Max A to return to bed. Continue PT to progress strength, endurance and balance to improve all functional mobility.   Follow Up Recommendations  SNF     Equipment Recommendations       Recommendations for Other Services       Precautions / Restrictions Precautions Precautions: Fall Restrictions Weight Bearing Restrictions: Yes RLE Weight Bearing: Weight bearing as tolerated    Mobility  Bed Mobility Overal bed mobility: Needs Assistance Bed Mobility: Sit to Supine     Supine to sit: Max assist     General bed mobility comments: trunk and LEs  Transfers Overall transfer level: Needs assistance Equipment used: Rolling walker (2 wheeled) Transfers: Sit to/from Stand Sit to Stand: Mod assist;Min assist         General transfer comment: Initial Mod A with hand over hand placement of hands on chair. Once transfer initiated improved to Min A for full stand anc verbal cues to place hands on rw   Ambulation/Gait Ambulation/Gait assistance: Mod assist Ambulation Distance (Feet): 2 Feet Assistive device: Rolling walker (2 wheeled) Gait Pattern/deviations: Step-to pattern   Gait velocity interpretation: <1.8 ft/sec, indicative of risk for  recurrent falls General Gait Details: Heavy verbal and tactile cueing for stepping. Pt able to demonstrate low clearance but actual steps with increased time and cues. Assist to [facility] rw.    Stairs            Wheelchair Mobility    Modified Rankin (Stroke Patients Only)       Balance Overall balance assessment: Needs assistance Sitting-balance support: Bilateral upper extremity supported;Feet supported Sitting balance-Leahy Scale: Fair     Standing balance support: Bilateral upper extremity supported Standing balance-Leahy Scale: Poor                              Cognition Arousal/Alertness: Awake/alert(increassing tired once returned to bed) Behavior During Therapy: WFL for tasks assessed/performed Overall Cognitive Status: Within Functional Limits for tasks assessed(requires increased cues throughout)                                        Exercises General Exercises - Lower Extremity Ankle Circles/Pumps: AROM;AAROM;10 reps Quad Sets: Strengthening;Both;10 reps(tactile cues) Short Arc Quad: AAROM;10 reps Long Arc Quad: AROM;Both;10 reps;Seated Heel Slides: AAROM;Both;10 reps Hip ABduction/ADduction: AAROM;Both;10 reps(long sit) Straight Leg Raises: PROM;5 reps Hip Flexion/Marching: AROM;Both;10 reps;Seated;Other (comment)(tactile cues)    General Comments        Pertinent Vitals/Pain Pain Assessment: Faces Faces Pain Scale: Hurts even more Pain Location: RLE and L groin Pain Intervention(s): Monitored during session;Premedicated before session;Repositioned  Home Living Family/patient expects to be discharged to:: Skilled nursing facility                    Prior Function        Comments: unable to gather a lot of history secondary to confusion   PT Goals (current goals can now be found in the care plan section) Acute Rehab PT Goals Patient Stated Goal: unable to state PT Goal Formulation: Patient unable to  participate in goal setting Time For Goal Achievement: 01/21/18 Potential to Achieve Goals: Fair Progress towards PT goals: Progressing toward goals    Frequency    BID      PT Plan Current plan remains appropriate    Co-evaluation              AM-PAC PT "6 Clicks" Daily Activity  Outcome Measure  Difficulty turning over in bed (including adjusting bedclothes, sheets and blankets)?: Unable Difficulty moving from lying on back to sitting on the side of the bed? : Unable Difficulty sitting down on and standing up from a chair with arms (e.g., wheelchair, bedside commode, etc,.)?: Unable Help needed moving to and from a bed to chair (including a wheelchair)?: A Lot Help needed walking in hospital room?: Total Help needed climbing 3-5 steps with a railing? : Total 6 Click Score: 7    End of Session Equipment Utilized During Treatment: Gait belt Activity Tolerance: Patient limited by pain;Patient limited by fatigue;Other (comment)(slow processing) Patient left: in bed;with call bell/phone within reach;with bed alarm set;with SCD's reapplied   PT Visit Diagnosis: Difficulty in walking, not elsewhere classified (R26.2);Muscle weakness (generalized) (M62.81);Pain Pain - Right/Left: Right Pain - part of body: Hip     Time: [id number] PT Time Calculation (min) (ACUTE ONLY): 33 min  Charges:  $Gait Training: 8-22 mins $Therapeutic Exercise: 8-22 mins                    G Codes:        Scot DockHeidi E Quanita Barona, PTA 01/07/2018, 3:54 PM

## 2018-01-08 ENCOUNTER — Other Ambulatory Visit: Payer: Self-pay

## 2018-01-08 MED ORDER — DOCUSATE SODIUM 100 MG PO CAPS: 100.0000 mg | ORAL_CAPSULE | Freq: Two times a day (BID) | ORAL | 0 refills | Status: AC

## 2018-01-08 MED ORDER — ENOXAPARIN SODIUM 40 MG/0.4ML ~~LOC~~ SOLN: 40.0000 mg | SUBCUTANEOUS | 0 refills | Status: DC

## 2018-01-08 MED ORDER — TRAMADOL HCL 50 MG PO TABS: 50.0000 mg | ORAL_TABLET | Freq: Two times a day (BID) | ORAL | 0 refills | Status: DC

## 2018-01-08 MED ORDER — CEPHALEXIN 500 MG PO CAPS: 500.0000 mg | ORAL_CAPSULE | Freq: Two times a day (BID) | ORAL | 0 refills | Status: DC

## 2018-01-08 MED ORDER — OXYCODONE HCL 5 MG PO TABS: 5.0000 mg | ORAL_TABLET | Freq: Four times a day (QID) | ORAL | 0 refills | Status: DC | PRN

## 2018-01-08 MED ORDER — OXYCODONE HCL 5 MG PO TABS: 5.0000 mg | ORAL_TABLET | ORAL | 0 refills | Status: DC | PRN

## 2018-01-08 MED ORDER — CARVEDILOL 25 MG PO TABS: 25.0000 mg | ORAL_TABLET | Freq: Two times a day (BID) | ORAL | 0 refills | Status: AC

## 2018-01-08 NOTE — Progress Notes (Signed)
Physical Therapy Treatment Patient Details Name: Trevor GlassingJohn Porter MRN: [id number] DOB: 1935-09-18 Today's Date: 01/08/2018    History of Present Illness 83 y/o male who fell suffering a R hip fx and ultimately need ORIF.    PT Comments    Pt was able to participate relatively well with exercises in bed and with transfer/"ambulation" to the chair.  Pt was pleasantly confused t/o the session but did show good effort.  He had general c/o pain with R LE movement, but did not seem limited due to this.    Follow Up Recommendations  SNF     Equipment Recommendations       Recommendations for Other Services       Precautions / Restrictions Precautions Precautions: Fall Restrictions RLE Weight Bearing: Weight bearing as tolerated    Mobility  Bed Mobility Overal bed mobility: Needs Assistance Bed Mobility: Sit to Supine     Supine to sit: Mod assist        Transfers Overall transfer level: Needs assistance Equipment used: Rolling walker (2 wheeled) Transfers: Sit to/from Stand Sit to Stand: Mod assist         General transfer comment: Pt was able to rise with heavy cuing, raised bed height and moderate assist to keep weight forward  Ambulation/Gait Ambulation/Gait assistance: Mod assist Ambulation Distance (Feet): 4 Feet Assistive device: Rolling walker (2 wheeled)       General Gait Details: Pt is able to take a few very small, labored steps but did manage to take weight through the R and maintain balance at times.     Stairs            Wheelchair Mobility    Modified Rankin (Stroke Patients Only)       Balance Overall balance assessment: Needs assistance Sitting-balance support: Bilateral upper extremity supported;Feet supported Sitting balance-Leahy Scale: Fair       Standing balance-Leahy Scale: Poor                              Cognition Arousal/Alertness: Awake/alert Behavior During Therapy: WFL for tasks  assessed/performed Overall Cognitive Status: History of cognitive impairments - at baseline                                        Exercises General Exercises - Lower Extremity Ankle Circles/Pumps: AROM;AAROM;10 reps Quad Sets: Strengthening;Both;10 reps Gluteal Sets: Strengthening;10 reps Short Arc Quad: 10 reps;Strengthening Heel Slides: AAROM;Both;10 reps Hip ABduction/ADduction: AAROM;Both;10 reps    General Comments        Pertinent Vitals/Pain Pain Assessment: Faces Faces Pain Scale: Hurts even more    Home Living Family/patient expects to be discharged to:: Skilled nursing facility                    Prior Function            PT Goals (current goals can now be found in the care plan section) Progress towards PT goals: Progressing toward goals    Frequency    BID      PT Plan Current plan remains appropriate    Co-evaluation              AM-PAC PT "6 Clicks" Daily Activity  Outcome Measure  Difficulty turning over in bed (including adjusting bedclothes, sheets and blankets)?: Unable Difficulty moving from lying  on back to sitting on the side of the bed? : Unable Difficulty sitting down on and standing up from a chair with arms (e.g., wheelchair, bedside commode, etc,.)?: Unable Help needed moving to and from a bed to chair (including a wheelchair)?: A Lot Help needed walking in hospital room?: Total Help needed climbing 3-5 steps with a railing? : Total 6 Click Score: 7    End of Session Equipment Utilized During Treatment: Gait belt Activity Tolerance: Patient limited by pain;Patient limited by fatigue Patient left: with call bell/phone within reach;with chair alarm set   PT Visit Diagnosis: Difficulty in walking, not elsewhere classified (R26.2);Muscle weakness (generalized) (M62.81);Pain Pain - Right/Left: Right Pain - part of body: Hip     Time: 1610-9604 PT Time Calculation (min) (ACUTE ONLY): 28  min  Charges:  $Therapeutic Exercise: 8-22 mins $Therapeutic Activity: 8-22 mins                    G Codes:       Malachi Pro, DPT 01/08/2018, 11:45 AM

## 2018-01-08 NOTE — Clinical Social Work Note (Addendum)
Patient to be d/c'ed today to [facility] room [address].  Patient and family agreeable to plans will transport via ems RN to call report.  CSW updated patient's daughter Elberta LeatherwoodMary Fields [phone].   Windell MouldingEric Cruz Bong, MSW, Theresia MajorsLCSWA [phone]

## 2018-01-08 NOTE — Care Management (Signed)
RNCM has faxed discharge summary to [facility] for their record.

## 2018-01-08 NOTE — Discharge Summary (Signed)
[facility] [place] [facility]   PATIENT NAME: Trevor Porter    MR#:  [id number]  DATE OF BIRTH:  [birth year]  DATE OF ADMISSION:  01/05/2018 ADMITTING PHYSICIAN: Oralia Manisavid Willis, MD  DATE OF DISCHARGE: 01/08/2018  PRIMARY CARE PHYSICIAN: Patient, No Pcp Per    ADMISSION DIAGNOSIS:  Fall [W19.XXXA]  DISCHARGE DIAGNOSIS:  Acute Right intertrochanteric femur fracture s/p Intramedullary nailing of right femur with cephalomedullary device Atrial fibrillation with RVR Dementia UTI SECONDARY DIAGNOSIS:   Past Medical History:  Diagnosis Date  . Dementia   . HTN (hypertension)   . Insomnia   . Paroxysmal atrial fibrillation [facility](HCC)     HOSPITAL COURSE:   Trevor Porter presents with mechanical fall at home and subsequent right hip fracture. Patient states that he fell on some tile  1. Acute on chronic AF (paroxysmal atrial fibrillation) with RVR -continue rate controlling medications --Heart rate anywhere from 90-100 -Replaced mag IV yesterday -Potassium normal -Cardiology consultation with Dr. Lady GaryFath. -Echo shows EF of 45% -on coreg---increase dose to 25 mg bid  2.  Right intertrochanteric femur fracture status post mechanical fall at [facility] -Orthopedic consultation with Dr. Allena KatzPatel appreciated -Patient is postop day 2.  Physical therapy input noted  -Child psychotherapistocial worker for discharge planning--to [facility] (pt is there long term)  3.HTN (hypertension) -continue home meds  4.Insomnia -home dose trazodone nightly  5. UTI,recurrent Iv rocephin 1 g daily--- changed to Keflex  Overall improving. Vitals ok D/c to [facility] CONSULTS OBTAINED:  Treatment Team:  Enedina FinnerPatel, Dominick Morella, MD Dalia HeadingFath, Kenneth A, MD Signa KellPatel, Sunny, MD  DRUG ALLERGIES:  No Known Allergies  DISCHARGE MEDICATIONS:   Allergies as of 01/08/2018   No Known Allergies     Medication List    STOP taking these medications   lidocaine 4 % cream Commonly known as:   LMX     TAKE these medications   acetaminophen 650 MG CR tablet Commonly known as:  TYLENOL Take 650 mg by mouth every 8 (eight) hours as needed for pain.   aspirin EC 81 MG tablet Take 81 mg by mouth daily.   carvedilol 25 MG tablet Commonly known as:  COREG Take 1 tablet (25 mg total) by mouth 2 (two) times daily with a meal. What changed:    medication strength  how much to take   cephALEXin 500 MG capsule Commonly known as:  KEFLEX Take 1 capsule (500 mg total) by mouth every 12 (twelve) hours.   docusate sodium 100 MG capsule Commonly known as:  COLACE Take 1 capsule (100 mg total) by mouth 2 (two) times daily.   enoxaparin 40 MG/0.4ML injection Commonly known as:  LOVENOX Inject 0.4 mLs (40 mg total) into the skin daily.   oxyCODONE 5 MG immediate release tablet Commonly known as:  Oxy IR/ROXICODONE Take 1 tablet (5 mg total) by mouth every 6 (six) hours as needed for severe pain.   traMADol 50 MG tablet Commonly known as:  ULTRAM Take 1 tablet (50 mg total) by mouth 2 (two) times daily.   traZODone 50 MG tablet Commonly known as:  DESYREL Take 50 mg by mouth at bedtime.       If you experience worsening of your admission symptoms, develop shortness of breath, life threatening emergency, suicidal or homicidal thoughts you must seek medical attention immediately by calling 911 or calling your MD immediately  if symptoms less severe.  You Must read complete instructions/literature along with all the possible adverse  reactions/side effects for all the Medicines you take and that have been prescribed to you. Take any new Medicines after you have completely understood and accept all the possible adverse reactions/side effects.   Please note  You were cared for by a hospitalist during your hospital stay. If you have any questions about your discharge medications or the care you received while you were in the hospital after you are discharged, you can call the unit  and asked to speak with the hospitalist on call if the hospitalist that took care of you is not available. Once you are discharged, your primary care physician will handle any further medical issues. Please note that NO REFILLS for any discharge medications will be authorized once you are discharged, as it is imperative that you return to your primary care physician (or establish a relationship with a primary care physician if you do not have one) for your aftercare needs so that they can reassess your need for medications and monitor your lab values. Today   SUBJECTIVE   No new issues per RN Pt resting quietly  VITAL SIGNS:  Blood pressure 131/79, pulse (!) 109, temperature 99.1 F (37.3 C), temperature source Oral, resp. rate 18, height 5\' 11"  (1.803 m), weight 65.8 kg (145 lb), SpO2 93 %.  I/O:    Intake/Output Summary (Last 24 hours) at 01/08/2018 0755 Last data filed at 01/08/2018 1610 Gross per 24 hour  Intake 240 ml  Output -  Net 240 ml    PHYSICAL EXAMINATION:  GENERAL:  83 y.o.-year-old patient lying in the bed with no acute distress.  EYES: Pupils equal, round, reactive to light and accommodation. No scleral icterus. Extraocular muscles intact.  HEENT: Head atraumatic, normocephalic. Oropharynx and nasopharynx clear.  NECK:  Supple, no jugular venous distention. No thyroid enlargement, no tenderness.  LUNGS: Normal breath sounds bilaterally, no wheezing, rales,rhonchi or crepitation. No use of accessory muscles of respiration.  CARDIOVASCULAR: S1, S2 normal. No murmurs, rubs, or gallops.  ABDOMEN: Soft, non-tender, non-distended. Bowel sounds present. No organomegaly or mass.  EXTREMITIES: No pedal edema, cyanosis, or clubbing.  NEUROLOGIC: grossly non focal Sensation intact. Gait not checked.  PSYCHIATRIC: The patient is aler.  SKIN: No obvious rash, lesion, or ulcer.   DATA REVIEW:   CBC  Recent Labs  Lab 01/05/18 2119  WBC 8.8  HGB 11.8*  HCT 36.1*  PLT 147*     Chemistries  Recent Labs  Lab 01/05/18 2119 01/06/18 0915  NA 135  --   K 3.7  --   CL 100*  --   CO2 25  --   GLUCOSE 99  --   BUN 10  --   CREATININE 0.79  --   CALCIUM 9.1  --   MG  --  1.4*  AST 26  --   ALT 20  --   ALKPHOS 94  --   BILITOT 0.6  --     Microbiology Results   Recent Results (from the past 240 hour(s))  Surgical pcr screen     Status: None   Collection Time: 01/06/18  6:12 AM  Result Value Ref Range Status   MRSA, PCR NEGATIVE NEGATIVE Final   Staphylococcus aureus NEGATIVE NEGATIVE Final    Comment: (NOTE) The Xpert SA Assay (FDA approved for NASAL specimens in patients 34 years of age and older), is one component of a comprehensive surveillance program. It is not intended to diagnose infection nor to guide or monitor treatment. Performed at [facility], [address] [place], [place] [address]     RADIOLOGY:  Dg Hip Operative Unilat W Or W/o Pelvis Right  Result Date: 01/06/2018 CLINICAL DATA:  Right hip fixation EXAM: OPERATIVE RIGHT HIP WITH PELVIS COMPARISON:  01/05/2018 FLUOROSCOPY TIME:  Radiation Exposure Index (as provided by the fluoroscopic device): 4.37 mGy If the device does not provide the exposure index: Fluoroscopy Time:  59 seconds Number of Acquired Images:  5 FINDINGS: Medullary rod is noted in the proximal femur. Fixation screws noted traversing the femoral neck. The fracture fragments are in near anatomic alignment. IMPRESSION: ORIF of proximal right femoral fracture. Electronically Signed   By: Alcide Clever M.D.   On: 01/06/2018 16:09     Management plans discussed with the patient, family and they are in agreement.  CODE STATUS:     Code Status Orders  (From admission, onward)        Start     Ordered   01/06/18 0843  Do not attempt resuscitation (DNR)  Continuous    Question Answer Comment  In the event of cardiac or respiratory ARREST Do not call a "code blue"   In the event of cardiac or  respiratory ARREST Do not perform Intubation, CPR, defibrillation or ACLS   In the event of cardiac or respiratory ARREST Use medication by any route, position, wound care, and other measures to relive pain and suffering. May use oxygen, suction and manual treatment of airway obstruction as needed for comfort.      01/06/18 0842    Code Status History    Date Active Date Inactive Code Status Order ID Comments User Context   01/06/2018 0508 01/06/2018 0842 Full Code [id number]  Oralia Manis, MD ED      TOTAL TIME TAKING CARE OF THIS PATIENT: 40 minutes.    Enedina Finner M.D on 01/08/2018 at 7:55 AM  Between 7am to 6pm - Pager - [phone] After 6pm go to [email or web address] - password [email or web address]  [facility]  Office  [phone]  CC: Primary care physician; Patient, No Pcp Per

## 2018-01-08 NOTE — Progress Notes (Signed)
  Subjective: 2 Days Post-Op Procedure(s) (LRB): INTRAMEDULLARY (IM) NAIL INTERTROCHANTRIC (Right) Patient reports pain as mild.   Patient seen in rounds with Dr. Allena KatzPatel. Patient is well, and has had no acute complaints or problems Plan is to go Rehab after hospital stay. Negative for chest pain and shortness of breath Fever: no Gastrointestinal:negative for nausea and vomiting  Objective: Vital signs in last 24 hours: Temp:  [97.9 F (36.6 C)-99.7 F (37.6 C)] 99.7 F (37.6 C) (03/22 0404) Pulse Rate:  [82-101] 90 (03/22 0404) Resp:  [18-20] 18 (03/22 0404) BP: (96-138)/(49-75) 124/75 (03/22 0404) SpO2:  [95 %-97 %] 97 % (03/22 0404)  Intake/Output from previous day:  Intake/Output Summary (Last 24 hours) at 01/08/2018 0734 Last data filed at 01/08/2018 [id number] Gross per 24 hour  Intake 240 ml  Output -  Net 240 ml    Intake/Output this shift: No intake/output data recorded.  Labs: Recent Labs    01/05/18 2119  HGB 11.8*   Recent Labs    01/05/18 2119  WBC 8.8  RBC 4.53  HCT 36.1*  PLT 147*   Recent Labs    01/05/18 2119  NA 135  K 3.7  CL 100*  CO2 25  BUN 10  CREATININE 0.79  GLUCOSE 99  CALCIUM 9.1   No results for input(s): LABPT, INR in the last 72 hours.   EXAM General - Patient is more alert this morning. Extremity - Intact pulses distally Dorsiflexion/Plantar flexion intact No cellulitis present Compartment soft Dressing/Incision - clean, dry, no drainage Motor Function - intact, moving foot and toes well on exam.   Past Medical History:  Diagnosis Date  . Dementia   . HTN (hypertension)   . Insomnia   . Paroxysmal atrial fibrillation (HCC)     Assessment/Plan: 2 Days Post-Op Procedure(s) (LRB): INTRAMEDULLARY (IM) NAIL INTERTROCHANTRIC (Right) Principal Problem:   Closed right hip fracture (HCC) Active Problems:   AF (paroxysmal atrial fibrillation) (HCC)   HTN (hypertension)   Insomnia  Estimated body mass index is 20.22  kg/m as calculated from the following:   Height as of this encounter: 5\' 11"  (1.803 m).   Weight as of this encounter: 65.8 kg (145 lb). Advance diet Up with therapy D/C IV fluids Discharge to SNF when cleared medically  DVT Prophylaxis - Lovenox, Foot Pumps and TED hose Weight-Bearing as tolerated to right leg  Dedra Skeensodd Eline Geng, PA-C Orthopaedic Surgery 01/08/2018, 7:34 AM

## 2018-01-08 NOTE — Progress Notes (Signed)
Order received to discontinue telemetry from Dr. Enedina FinnerSona Patel

## 2018-01-08 NOTE — Progress Notes (Signed)
Report called and given to Tawanna Coolerodd, Charity fundraiserN at [facility]. EMS called for transport. Will remove IV from pt and assist with getting dressed and ready for transport.

## 2018-01-13 ENCOUNTER — Inpatient Hospital Stay
Admission: EM | Admit: 2018-01-13 | Discharge: 2018-01-16 | DRG: 071 | Disposition: A | Discharge status: Skilled Nursing Facility | Payer: Medicare Other | Attending: Internal Medicine | Admitting: Internal Medicine

## 2018-01-13 ENCOUNTER — Other Ambulatory Visit: Payer: Self-pay

## 2018-01-13 DIAGNOSIS — I480 Paroxysmal atrial fibrillation: Secondary | ICD-10-CM | POA: Diagnosis present

## 2018-01-13 DIAGNOSIS — Z7982 Long term (current) use of aspirin: Secondary | ICD-10-CM

## 2018-01-13 DIAGNOSIS — G9341 Metabolic encephalopathy: Secondary | ICD-10-CM | POA: Diagnosis present

## 2018-01-13 DIAGNOSIS — M25551 Pain in right hip: Secondary | ICD-10-CM

## 2018-01-13 DIAGNOSIS — B9620 Unspecified Escherichia coli [E. coli] as the cause of diseases classified elsewhere: Secondary | ICD-10-CM | POA: Diagnosis present

## 2018-01-13 DIAGNOSIS — F0390 Unspecified dementia without behavioral disturbance: Secondary | ICD-10-CM | POA: Diagnosis present

## 2018-01-13 DIAGNOSIS — I10 Essential (primary) hypertension: Secondary | ICD-10-CM | POA: Diagnosis present

## 2018-01-13 DIAGNOSIS — N3001 Acute cystitis with hematuria: Secondary | ICD-10-CM | POA: Diagnosis present

## 2018-01-13 DIAGNOSIS — Z79899 Other long term (current) drug therapy: Secondary | ICD-10-CM | POA: Diagnosis not present

## 2018-01-13 DIAGNOSIS — D649 Anemia, unspecified: Secondary | ICD-10-CM

## 2018-01-13 DIAGNOSIS — R410 Disorientation, unspecified: Secondary | ICD-10-CM

## 2018-01-13 DIAGNOSIS — Z880 Allergy status to penicillin: Secondary | ICD-10-CM | POA: Diagnosis not present

## 2018-01-13 DIAGNOSIS — G4700 Insomnia, unspecified: Secondary | ICD-10-CM | POA: Diagnosis present

## 2018-01-13 DIAGNOSIS — Z66 Do not resuscitate: Secondary | ICD-10-CM | POA: Diagnosis present

## 2018-01-13 DIAGNOSIS — D62 Acute posthemorrhagic anemia: Secondary | ICD-10-CM | POA: Diagnosis present

## 2018-01-13 DIAGNOSIS — G9340 Encephalopathy, unspecified: Secondary | ICD-10-CM | POA: Diagnosis present

## 2018-01-13 DIAGNOSIS — Z9889 Other specified postprocedural states: Secondary | ICD-10-CM | POA: Diagnosis not present

## 2018-01-13 DIAGNOSIS — Z7901 Long term (current) use of anticoagulants: Secondary | ICD-10-CM | POA: Diagnosis not present

## 2018-01-13 LAB — CBC WITH DIFFERENTIAL/PLATELET
BASOS PCT: 1 %
Basophils Absolute: 0 10*3/uL (ref 0–0.1)
EOS ABS: 0.1 10*3/uL (ref 0–0.7)
Eosinophils Relative: 2 %
HCT: 23.7 % — ABNORMAL LOW (ref 40.0–52.0)
Hemoglobin: 7.7 g/dL — ABNORMAL LOW (ref 13.0–18.0)
LYMPHS ABS: 1.2 10*3/uL (ref 1.0–3.6)
Lymphocytes Relative: 13 %
MCH: 25.9 pg — AB (ref 26.0–34.0)
MCHC: 32.6 g/dL (ref 32.0–36.0)
MCV: 79.6 fL — ABNORMAL LOW (ref 80.0–100.0)
Monocytes Absolute: 0.6 10*3/uL (ref 0.2–1.0)
Monocytes Relative: 6 %
NEUTROS PCT: 78 %
Neutro Abs: 7.4 10*3/uL — ABNORMAL HIGH (ref 1.4–6.5)
PLATELETS: 186 10*3/uL (ref 150–440)
RBC: 2.98 MIL/uL — AB (ref 4.40–5.90)
RDW: 15.6 % — ABNORMAL HIGH (ref 11.5–14.5)
WBC: 9.4 10*3/uL (ref 3.8–10.6)

## 2018-01-13 LAB — TROPONIN I: Troponin I: <0.03 ng/mL (ref <0.03)

## 2018-01-13 LAB — COMPREHENSIVE METABOLIC PANEL
ALT: 9 U/L — AB (ref 17–63)
ANION GAP: 10 (ref 5–15)
AST: 22 U/L (ref 15–41)
Albumin: 3.1 g/dL — ABNORMAL LOW (ref 3.5–5.0)
Alkaline Phosphatase: 74 U/L (ref 38–126)
BUN: 14 mg/dL (ref 6–20)
CHLORIDE: 97 mmol/L — AB (ref 101–111)
CO2: 25 mmol/L (ref 22–32)
CREATININE: 0.83 mg/dL (ref 0.61–1.24)
Calcium: 8.5 mg/dL — ABNORMAL LOW (ref 8.9–10.3)
GFR calc non Af Amer: >60 mL/min (ref >60)
GLUCOSE: 117 mg/dL — AB (ref 65–99)
Potassium: 3.4 mmol/L — ABNORMAL LOW (ref 3.5–5.1)
SODIUM: 132 mmol/L — AB (ref 135–145)
Total Bilirubin: 1.7 mg/dL — ABNORMAL HIGH (ref 0.3–1.2)
Total Protein: 6.3 g/dL — ABNORMAL LOW (ref 6.5–8.1)

## 2018-01-13 LAB — URINALYSIS, COMPLETE (UACMP) WITH MICROSCOPIC
BILIRUBIN URINE: NEGATIVE
Glucose, UA: NEGATIVE mg/dL
Ketones, ur: NEGATIVE mg/dL
NITRITE: POSITIVE — AB
PROTEIN: 30 mg/dL — AB
Specific Gravity, Urine: 1.016 (ref 1.005–1.030)
pH: 5 (ref 5.0–8.0)

## 2018-01-13 LAB — PROTIME-INR
INR: 1.08
Prothrombin Time: 13.9 seconds (ref 11.4–15.2)

## 2018-01-13 LAB — APTT: aPTT: 38 seconds — ABNORMAL HIGH (ref 24–36)

## 2018-01-13 MED ORDER — SODIUM CHLORIDE 0.9 % IV SOLN
Freq: Once | INTRAVENOUS | Status: AC
  Administered 2018-01-14: 01:00:00 via INTRAVENOUS

## 2018-01-13 MED ORDER — PIPERACILLIN-TAZOBACTAM 3.375 G IVPB: 3.3750 g | Freq: Three times a day (TID) | INTRAVENOUS | Status: DC

## 2018-01-13 MED ORDER — DOCUSATE SODIUM 100 MG PO CAPS
100.0000 mg | ORAL_CAPSULE | Freq: Two times a day (BID) | ORAL | Status: DC
  Administered 2018-01-14 – 2018-01-16 (×4): 100 mg via ORAL
  Filled 2018-01-13 (×4): qty 1

## 2018-01-13 MED ORDER — ONDANSETRON HCL 4 MG/2ML IJ SOLN: 4.0000 mg | Freq: Four times a day (QID) | INTRAMUSCULAR | Status: DC | PRN

## 2018-01-13 MED ORDER — ACETAMINOPHEN 325 MG PO TABS: 650.0000 mg | ORAL_TABLET | Freq: Four times a day (QID) | ORAL | Status: DC | PRN

## 2018-01-13 MED ORDER — BISACODYL 5 MG PO TBEC: 5.0000 mg | DELAYED_RELEASE_TABLET | Freq: Every day | ORAL | Status: DC | PRN

## 2018-01-13 MED ORDER — ACETAMINOPHEN 650 MG RE SUPP: 650.0000 mg | Freq: Four times a day (QID) | RECTAL | Status: DC | PRN

## 2018-01-13 MED ORDER — ONDANSETRON HCL 4 MG PO TABS: 4.0000 mg | ORAL_TABLET | Freq: Four times a day (QID) | ORAL | Status: DC | PRN

## 2018-01-13 MED ORDER — TRAZODONE HCL 50 MG PO TABS
50.0000 mg | ORAL_TABLET | Freq: Every day | ORAL | Status: DC
  Administered 2018-01-14 – 2018-01-15 (×3): 50 mg via ORAL
  Filled 2018-01-13 (×3): qty 1

## 2018-01-13 MED ORDER — TRAMADOL HCL 50 MG PO TABS
50.0000 mg | ORAL_TABLET | Freq: Two times a day (BID) | ORAL | Status: DC
  Administered 2018-01-14 – 2018-01-16 (×6): 50 mg via ORAL
  Filled 2018-01-13 (×6): qty 1

## 2018-01-13 MED ORDER — HYDROCODONE-ACETAMINOPHEN 5-325 MG PO TABS
1.0000 | ORAL_TABLET | ORAL | Status: DC | PRN
  Administered 2018-01-15: 03:00:00 1 via ORAL
  Filled 2018-01-13: qty 1

## 2018-01-13 MED ORDER — CARVEDILOL 25 MG PO TABS
25.0000 mg | ORAL_TABLET | Freq: Two times a day (BID) | ORAL | Status: DC
  Administered 2018-01-14 – 2018-01-16 (×5): 25 mg via ORAL
  Filled 2018-01-13 (×5): qty 1

## 2018-01-13 MED ORDER — PIPERACILLIN-TAZOBACTAM 3.375 G IVPB 30 MIN
3.3750 g | Freq: Once | INTRAVENOUS | Status: AC
  Administered 2018-01-13: 3.375 g via INTRAVENOUS
  Filled 2018-01-13: qty 50

## 2018-01-13 MED ORDER — DOCUSATE SODIUM 100 MG PO CAPS
100.0000 mg | ORAL_CAPSULE | Freq: Two times a day (BID) | ORAL | Status: DC
Start: 2018-01-13 — End: 2018-01-14

## 2018-01-13 MED ORDER — SODIUM CHLORIDE 0.9 % IV SOLN
1.0000 g | Freq: Once | INTRAVENOUS | Status: DC
  Filled 2018-01-13: qty 10

## 2018-01-13 MED ORDER — TRAZODONE HCL 50 MG PO TABS: 25.0000 mg | ORAL_TABLET | Freq: Every evening | ORAL | Status: DC | PRN

## 2018-01-13 NOTE — ED Notes (Signed)
Rebecca RN, aware of bed assigned 

## 2018-01-13 NOTE — ED Provider Notes (Signed)
[facility] Emergency Department Provider Note  ____________________________________________  Time seen: Approximately 7:46 PM  I have reviewed the triage vital signs and the nursing notes.   HISTORY  Chief Complaint Altered Mental Status and Urinary Tract Infection  Level 5 caveat:  Portions of the history and physical were unable to be obtained due to dementia   HPI Trevor Porter is a 83 y.o. male POD 7 from R hip ORIF on lovenox shots, Dementia, hypertension, paroxysmal atrial fibrillation who presents for evaluation of altered mental status. Patient coming from skilled nursing facility with progressively worsening confusion. According to the facility there concerned that he might have a UTI. They also did labs recently which showed hemoglobin trending down. No obvious source of bleeding per patient and staff. He denies melena, hemoptysis, hematuria, hematemesis. Patient reports no pain at this time. He is alert and oriented 2. He denies feeling confused. He denies chest pain, URI symptoms, shortness of breath, abdominal pain, hip pain or dysuria. Patient does smell very strongly of urine.  Past Medical History:  Diagnosis Date  . Dementia   . HTN (hypertension)   . Insomnia   . Paroxysmal atrial fibrillation [facility])     Patient Active Problem List   Diagnosis Date Noted  . Closed right hip fracture (HCC) 01/06/2018  . AF (paroxysmal atrial fibrillation) (HCC) 01/06/2018  . HTN (hypertension) 01/06/2018  . Insomnia 01/06/2018    Past Surgical History:  Procedure Laterality Date  . INTRAMEDULLARY (IM) NAIL INTERTROCHANTERIC Right 01/06/2018   Procedure: INTRAMEDULLARY (IM) NAIL INTERTROCHANTRIC;  Surgeon: Signa Kell, MD;  Location: [facility] ORS;  Service: Orthopedics;  Laterality: Right;  . NO PAST SURGERIES      Prior to Admission medications   Medication Sig Start Date End Date Taking? Authorizing Provider  acetaminophen (TYLENOL) 650 MG CR tablet  Take 650 mg by mouth every 8 (eight) hours as needed for pain.    [provider]  aspirin EC 81 MG tablet Take 81 mg by mouth daily.    [provider]  carvedilol (COREG) 25 MG tablet Take 1 tablet (25 mg total) by mouth 2 (two) times daily with a meal. 01/08/18   Enedina Finner, MD  cephALEXin (KEFLEX) 500 MG capsule Take 1 capsule (500 mg total) by mouth every 12 (twelve) hours. 01/08/18   Enedina Finner, MD  docusate sodium (COLACE) 100 MG capsule Take 1 capsule (100 mg total) by mouth 2 (two) times daily. 01/08/18   Enedina Finner, MD  enoxaparin (LOVENOX) 40 MG/0.4ML injection Inject 0.4 mLs (40 mg total) into the skin daily. 01/08/18   Enedina Finner, MD  oxyCODONE (OXY IR/ROXICODONE) 5 MG immediate release tablet Take 1 tablet (5 mg total) by mouth every 6 (six) hours as needed for severe pain. 01/08/18   Enedina Finner, MD  traMADol (ULTRAM) 50 MG tablet Take 1 tablet (50 mg total) by mouth 2 (two) times daily. 01/08/18   Enedina Finner, MD  traZODone (DESYREL) 50 MG tablet Take 50 mg by mouth at bedtime.    [provider]    Allergies Patient has no known allergies.  Family History  Problem Relation Age of Onset  . Hypertension Other     Social History Social History   Tobacco Use  . Smoking status: Never Smoker  . Smokeless tobacco: Never Used  Substance Use Topics  . Alcohol use: No    Frequency: Never  . Drug use: No    Review of Systems  Constitutional: Negative  for fever. + confusion Eyes: Negative for visual changes. ENT: Negative for sore throat. Neck: No neck pain  Cardiovascular: Negative for chest pain. Respiratory: Negative for shortness of breath. Gastrointestinal: Negative for abdominal pain, vomiting or diarrhea. Genitourinary: Negative for dysuria. Musculoskeletal: Negative for back pain. Skin: Negative for rash. Neurological: Negative for headaches, weakness or numbness. Psych: No SI or  HI  ____________________________________________   PHYSICAL EXAM:  VITAL SIGNS: ED Triage Vitals  Enc Vitals Group     BP 01/13/18 1925 121/71     Pulse Rate 01/13/18 1925 85     Resp 01/13/18 1925 17     Temp 01/13/18 1925 97.9 F (36.6 C)     Temp Source 01/13/18 1925 Oral     SpO2 01/13/18 1925 97 %     Weight 01/13/18 1926 145 lb (65.8 kg)     Height 01/13/18 1926 5\' 11"  (1.803 m)     Head Circumference --      Peak Flow --      Pain Score 01/13/18 1926 0     Pain Loc --      Pain Edu? --      Excl. in GC? --     Constitutional: Alert and oriented x 2. Well appearing and in no apparent distress. HEENT:      Head: Normocephalic and atraumatic.         Eyes: Conjunctivae are normal. Sclera is non-icteric.       Mouth/Throat: Mucous membranes are moist.       Neck: Supple with no signs of meningismus. Cardiovascular: Regular rate and rhythm. No murmurs, gallops, or rubs. 2+ symmetrical distal pulses are present in all extremities. No JVD. Respiratory: Normal respiratory effort. Lungs are clear to auscultation bilaterally. No wheezes, crackles, or rhonchi.  Gastrointestinal: Soft, non tender, and non distended with positive bowel sounds. No rebound or guarding. Rectal exam showing brown stool guaiac negative. Musculoskeletal: Surgical site looks well healing with some bruising, dry surgical scar, good ROM of the hip. Nontender with normal range of motion in all extremities. No edema, cyanosis, or erythema of extremities. Neurologic: Normal speech and language. Face is symmetric. Moving all extremities. No gross focal neurologic deficits are appreciated. Skin: Skin is warm, dry and intact. No rash noted. Psychiatric: Mood and affect are normal. Speech and behavior are normal.  ____________________________________________   LABS (all labs ordered are listed, but only abnormal results are displayed)  Labs Reviewed  CBC WITH DIFFERENTIAL/PLATELET - Abnormal; Notable for the  following components:      Result Value   RBC 2.98 (*)    Hemoglobin 7.7 (*)    HCT 23.7 (*)    MCV 79.6 (*)    MCH 25.9 (*)    RDW 15.6 (*)    Neutro Abs 7.4 (*)    All other components within normal limits  COMPREHENSIVE METABOLIC PANEL - Abnormal; Notable for the following components:   Sodium 132 (*)    Potassium 3.4 (*)    Chloride 97 (*)    Glucose, Bld 117 (*)    Calcium 8.5 (*)    Total Protein 6.3 (*)    Albumin 3.1 (*)    ALT 9 (*)    Total Bilirubin 1.7 (*)    All other components within normal limits  URINALYSIS, COMPLETE (UACMP) WITH MICROSCOPIC - Abnormal; Notable for the following components:   Color, Urine AMBER (*)    APPearance CLOUDY (*)    Hgb urine dipstick SMALL (*)  Protein, ur 30 (*)    Nitrite POSITIVE (*)    Leukocytes, UA MODERATE (*)    Bacteria, UA MANY (*)    Squamous Epithelial / LPF 0-5 (*)    All other components within normal limits  APTT - Abnormal; Notable for the following components:   aPTT 38 (*)    All other components within normal limits  URINE CULTURE  PROTIME-INR  TROPONIN I  TYPE AND SCREEN   ____________________________________________  EKG  ED ECG REPORT I, Nita Sicklearolina Mitsuko Luera, the attending physician, personally viewed and interpreted this ECG.  Atrial fibrillation, rate of 76,  left bundle branch block, normal QTC, normal axis, no ST elevations or depressions. No prior for comparison. ____________________________________________  RADIOLOGY  none ____________________________________________   PROCEDURES  Procedure(s) performed: None Procedures Critical Care performed:  None ____________________________________________   INITIAL IMPRESSION / ASSESSMENT AND PLAN / ED COURSE   83 y.o. male POD 7 from R hip ORIF on lovenox shots, Dementia, hypertension, paroxysmal atrial fibrillation who presents for evaluation of altered mental status. facility was concerned the patient may have a UTI but also has trending  down hemoglobin in the setting of being on Lovenox shots. Patient is alert and oriented 2, neurologically intact, surgical site looks well healing with no obvious infection, patient has good range of motion with no clinical suspicion for septic joint. We'll check labs to evaluate for anemia and if that's present we'll do a rectal exam to make sure patient does not have a GI bleed while on Lovenox. We'll check labs and urinalysis to eval for dehydration, anemia, electrolyte abnormalities, UTI ia possible causes of patient's confusion.    _________________________ 8:51 PM on 01/13/2018 -----------------------------------------  patient found to have new anemia with a hemoglobin of 7.7 (hemoglobin a week ago was 11.8). Rectal exam is negative for blood. review of all note says patient's blood loss was estimated at 100cc. Patient does have some bruising around the surgical site. UA also positive for UTI, last culture grew E. Coli resistant to rocephin and keflex which is what patient was dc to SNF on. Susceptible to zosyn. No signs of sepsis. Will admit to Hospitalist for monitoring of anemia, UTI treatment, and delirium.   As part of my medical decision making, I reviewed the following data within the electronic MEDICAL RECORD NUMBER Nursing notes reviewed and incorporated, Labs reviewed , EKG interpreted , Old EKG reviewed, Old chart reviewed, Discussed with admitting physician , Notes from prior ED visits and [place] Controlled Substance Database    Pertinent labs & imaging results that were available during my care of the patient were reviewed by me and considered in my medical decision making (see chart for details).    ____________________________________________   FINAL CLINICAL IMPRESSION(S) / ED DIAGNOSES  Final diagnoses:  Delirium  Acute cystitis with hematuria  Anemia, unspecified type      NEW MEDICATIONS STARTED DURING THIS VISIT:  ED Discharge Orders    None       Note:  This  document was prepared using Dragon voice recognition software and may include unintentional dictation errors.    Nita SickleVeronese, [place], MD 01/13/18 2053

## 2018-01-13 NOTE — Progress Notes (Addendum)
Pharmacy Antibiotic Note  Trevor GlassingJohn Porter is a 83 y.o. male admitted on 01/13/2018 with UTI.  Pharmacy has been consulted for zosyn dosing. Abx switched to meropenem s/t to high MIC's w/ zosyn  Plan: Zosyn 3.375g IV q8h (4 hour infusion).  Will start meropenem 1g IV q8h  Height: 5\' 11"  (180.3 cm) Weight: 145 lb (65.8 kg) IBW/kg (Calculated) : 75.3  Temp (24hrs), Avg:97.9 F (36.6 C), Min:97.9 F (36.6 C), Max:97.9 F (36.6 C)  Recent Labs  Lab 01/13/18 1942  WBC 9.4  CREATININE 0.83    Estimated Creatinine Clearance: 66.1 mL/min (by C-G formula based on SCr of 0.83 mg/dL).    No Known Allergies   Thank you for allowing pharmacy to be a part of this patient's care.  Thomasene Rippleavid Mesa Janus, PharmD, BCPS Clinical Pharmacist 01/13/2018

## 2018-01-13 NOTE — ED Notes (Signed)
Corrie DandyMary - daughter [phone]

## 2018-01-13 NOTE — H&P (Signed)
[facility] - [place] at [facility]   PATIENT NAME: Trevor Porter    MR#:  [id number]  DATE OF BIRTH:  May 20, 1935  DATE OF ADMISSION:  01/13/2018  PRIMARY CARE PHYSICIAN: Patient, No Pcp Per   REQUESTING/REFERRING PHYSICIAN:   CHIEF COMPLAINT:   Chief Complaint  Patient presents with  . Altered Mental Status  . Urinary Tract Infection    HISTORY OF PRESENT ILLNESS: Trevor Porter  is a 83 y.o. male with a known history of dementia, hypertension, paroxysmal atrial fibrillation and status post right hip surgery, 7 days ago. Patient was transferred from SNF for acute confusion, started earlier in the day and low hemoglobin level.  Patient is not able to provide any history due to confusion.  The information was obtained from reviewing the medical records and from discussion with ER physician. Per SNF records, there was no fever or chills, no nausea/vomiting/diarrhea. Blood test done in the emergency room are notable for hemoglobin level is 7.7.  MCV is 79.6.  Creatinine is 0.83.  Platelet count 186.  UA is positive for UTI. Patient is admitted for further evaluation and treatment.  PAST MEDICAL HISTORY:   Past Medical History:  Diagnosis Date  . Dementia   . HTN (hypertension)   . Insomnia   . Paroxysmal atrial fibrillation (HCC)     PAST SURGICAL HISTORY:  Past Surgical History:  Procedure Laterality Date  . INTRAMEDULLARY (IM) NAIL INTERTROCHANTERIC Right 01/06/2018   Procedure: INTRAMEDULLARY (IM) NAIL INTERTROCHANTRIC;  Surgeon: Signa Kell, MD;  Location: [facility] ORS;  Service: Orthopedics;  Laterality: Right;  . NO PAST SURGERIES      SOCIAL HISTORY:  Social History   Tobacco Use  . Smoking status: Never Smoker  . Smokeless tobacco: Never Used  Substance Use Topics  . Alcohol use: No    Frequency: Never    FAMILY HISTORY:  Family History  Problem Relation Age of Onset  . Hypertension Other     DRUG ALLERGIES: No Known Allergies  REVIEW  OF SYSTEMS:   Not able to obtain, due to patient being confused.   MEDICATIONS AT HOME:  Prior to Admission medications   Medication Sig Start Date End Date Taking? Authorizing Provider  acetaminophen (TYLENOL) 650 MG CR tablet Take 650 mg by mouth every 8 (eight) hours as needed for pain.    [provider]  aspirin EC 81 MG tablet Take 81 mg by mouth daily.    [provider]  carvedilol (COREG) 25 MG tablet Take 1 tablet (25 mg total) by mouth 2 (two) times daily with a meal. 01/08/18   Enedina Finner, MD  cephALEXin (KEFLEX) 500 MG capsule Take 1 capsule (500 mg total) by mouth every 12 (twelve) hours. 01/08/18   Enedina Finner, MD  docusate sodium (COLACE) 100 MG capsule Take 1 capsule (100 mg total) by mouth 2 (two) times daily. 01/08/18   Enedina Finner, MD  enoxaparin (LOVENOX) 40 MG/0.4ML injection Inject 0.4 mLs (40 mg total) into the skin daily. 01/08/18   Enedina Finner, MD  oxyCODONE (OXY IR/ROXICODONE) 5 MG immediate release tablet Take 1 tablet (5 mg total) by mouth every 6 (six) hours as needed for severe pain. 01/08/18   Enedina Finner, MD  traMADol (ULTRAM) 50 MG tablet Take 1 tablet (50 mg total) by mouth 2 (two) times daily. 01/08/18   Enedina Finner, MD  traZODone (DESYREL) 50 MG tablet Take 50 mg by mouth at bedtime.    [provider]  PHYSICAL EXAMINATION:   VITAL SIGNS: Blood pressure 121/71, pulse 85, temperature 97.9 F (36.6 C), temperature source Oral, resp. rate 17, height 5\' 11"  (1.803 m), weight 65.8 kg (145 lb), SpO2 97 %.  GENERAL:  83 y.o.-year-old patient lying in the bed with no acute distress; confused.  EYES: Pupils equal, round, reactive to light and accommodation. No scleral icterus. Extraocular muscles intact.  HEENT: Head atraumatic, normocephalic. Oropharynx and nasopharynx clear.  NECK:  Supple, no jugular venous distention. No thyroid enlargement, no tenderness.  LUNGS: Normal breath sounds bilaterally, no wheezing, rales,rhonchi or  crepitation. No use of accessory muscles of respiration.  CARDIOVASCULAR: S1, S2 normal. No S3/S4.  ABDOMEN: Soft, nontender, nondistended. Bowel sounds present. No organomegaly or mass.  EXTREMITIES: No pedal edema.  There is tenderness and reduced range of motion at the right hip joint, status post hip surgery. NEUROLOGIC: No focal weakness.  Gait not checked, as patient is not able to ambulate status post hip surgery.  PSYCHIATRIC: The patient is alert, but confused, disoriented.  [facility] and he is asking to go back home. SKIN: No obvious rash or ulcer.  Right hip surgical scar is healing well, no bleeding or discharge noted.  LABORATORY PANEL:   CBC Recent Labs  Lab 01/13/18 1942  WBC 9.4  HGB 7.7*  HCT 23.7*  PLT 186  MCV 79.6*  MCH 25.9*  MCHC 32.6  RDW 15.6*  LYMPHSABS 1.2  MONOABS 0.6  EOSABS 0.1  BASOSABS 0.0   ------------------------------------------------------------------------------------------------------------------  Chemistries  Recent Labs  Lab 01/13/18 1942  NA 132*  K 3.4*  CL 97*  CO2 25  GLUCOSE 117*  BUN 14  CREATININE 0.83  CALCIUM 8.5*  AST 22  ALT 9*  ALKPHOS 74  BILITOT 1.7*   ------------------------------------------------------------------------------------------------------------------ estimated creatinine clearance is 66.1 mL/min (by C-G formula based on SCr of 0.83 mg/dL). ------------------------------------------------------------------------------------------------------------------ No results for input(s): TSH, T4TOTAL, T3FREE, THYROIDAB in the last 72 hours.  Invalid input(s): FREET3   Coagulation profile Recent Labs  Lab 01/13/18 1942  INR 1.08   ------------------------------------------------------------------------------------------------------------------- No results for input(s): DDIMER in the last 72  hours. -------------------------------------------------------------------------------------------------------------------  Cardiac Enzymes Recent Labs  Lab 01/13/18 1942  TROPONINI <0.03   ------------------------------------------------------------------------------------------------------------------ Invalid input(s): POCBNP  ---------------------------------------------------------------------------------------------------------------  Urinalysis    Component Value Date/Time   COLORURINE AMBER (A) 01/13/2018 1942   APPEARANCEUR CLOUDY (A) 01/13/2018 1942   LABSPEC 1.016 01/13/2018 1942   PHURINE 5.0 01/13/2018 1942   GLUCOSEU NEGATIVE 01/13/2018 1942   HGBUR SMALL (A) 01/13/2018 1942   BILIRUBINUR NEGATIVE 01/13/2018 1942   KETONESUR NEGATIVE 01/13/2018 1942   PROTEINUR 30 (A) 01/13/2018 1942   NITRITE POSITIVE (A) 01/13/2018 1942   LEUKOCYTESUR MODERATE (A) 01/13/2018 1942     RADIOLOGY: No results found.  EKG: Orders placed or performed during the hospital encounter of 01/13/18  . ED EKG  . ED EKG    IMPRESSION AND PLAN:  1.  Acute metabolic encephalopathy, likely secondary to UTI.  We will start IV antibiotics, meropenem, based on the most recent urine culture.  We will start gentle IV hydration.  Continue to monitor clinically closely. 2.  UTI, see management as above under #1. 3.  Microcytic anemia, likely secondary to acute blood loss status post hip surgery.  Hemoglobin level is 7.7.  No active bleeding.  We will check iron studies, folate and B12 levels.  Will order iron IV and reevaluate the hemoglobin level. 4.  Status post recent  right hip surgery.  Will have PT/OT evaluate the patient. 5.  Hypertension, stable, will restart home medications.  All the records are reviewed and case discussed with ED provider.   CODE STATUS: Code Status History    Date Active Date Inactive Code Status Order ID Comments User Context   01/06/2018 0842 01/08/2018 1345 DNR  [id number]  Wilfred Lacy, RN Inpatient   01/06/2018 0508 01/06/2018 0842 Full Code [id number]  Oralia Manis, MD ED    Questions for Most Recent Historical Code Status (Order [id number])    Question Answer Comment   In the event of cardiac or respiratory ARREST Do not call a "code blue"    In the event of cardiac or respiratory ARREST Do not perform Intubation, CPR, defibrillation or ACLS    In the event of cardiac or respiratory ARREST Use medication by any route, position, wound care, and other measures to relive pain and suffering. May use oxygen, suction and manual treatment of airway obstruction as needed for comfort.        TOTAL TIME TAKING CARE OF THIS PATIENT: 40 minutes.    Cammy Copa M.D on 01/13/2018 at 11:15 PM  Between 7am to 6pm - Pager - [phone]  After 6pm go to [email or web address] - password [email or web address]  [facility]  Office  [phone]  CC: Primary care physician; Patient, No Pcp Per

## 2018-01-13 NOTE — ED Triage Notes (Signed)
[facility]. Family states that he has been acting confused and might possibly have a UTI. [facility] stated that [facility] were [facility] to [facility] to [facility] since his H & H has been dropping since surgery.

## 2018-01-14 ENCOUNTER — Inpatient Hospital Stay: Payer: Medicare Other

## 2018-01-14 LAB — CBC
HEMATOCRIT: 22.5 % — AB (ref 40.0–52.0)
Hemoglobin: 7.3 g/dL — ABNORMAL LOW (ref 13.0–18.0)
MCH: 26.1 pg (ref 26.0–34.0)
MCHC: 32.5 g/dL (ref 32.0–36.0)
MCV: 80.2 fL (ref 80.0–100.0)
PLATELETS: 189 10*3/uL (ref 150–440)
RBC: 2.81 MIL/uL — ABNORMAL LOW (ref 4.40–5.90)
RDW: 16.2 % — AB (ref 11.5–14.5)
WBC: 7.9 10*3/uL (ref 3.8–10.6)

## 2018-01-14 LAB — BASIC METABOLIC PANEL
Anion gap: 8 (ref 5–15)
BUN: 13 mg/dL (ref 6–20)
CO2: 23 mmol/L (ref 22–32)
CREATININE: 0.81 mg/dL (ref 0.61–1.24)
Calcium: 8 mg/dL — ABNORMAL LOW (ref 8.9–10.3)
Chloride: 102 mmol/L (ref 101–111)
GFR calc Af Amer: >60 mL/min (ref >60)
GLUCOSE: 98 mg/dL (ref 65–99)
POTASSIUM: 3.1 mmol/L — AB (ref 3.5–5.1)
Sodium: 133 mmol/L — ABNORMAL LOW (ref 135–145)

## 2018-01-14 LAB — IRON AND TIBC
Iron: 20 ug/dL — ABNORMAL LOW (ref 45–182)
SATURATION RATIOS: 7 % — AB (ref 17.9–39.5)
TIBC: 286 ug/dL (ref 250–450)
UIBC: 266 ug/dL

## 2018-01-14 LAB — VITAMIN B12: Vitamin B-12: 1013 pg/mL — ABNORMAL HIGH (ref 180–914)

## 2018-01-14 LAB — GLUCOSE, CAPILLARY: GLUCOSE-CAPILLARY: 98 mg/dL (ref 65–99)

## 2018-01-14 LAB — FOLATE: Folate: 11.7 ng/mL (ref >5.9)

## 2018-01-14 LAB — FERRITIN: Ferritin: 168 ng/mL (ref 24–336)

## 2018-01-14 MED ORDER — DIPHENHYDRAMINE HCL 25 MG PO CAPS: 25.0000 mg | ORAL_CAPSULE | Freq: Four times a day (QID) | ORAL | Status: DC | PRN

## 2018-01-14 MED ORDER — SODIUM CHLORIDE 0.9 % IV SOLN
1.0000 g | Freq: Three times a day (TID) | INTRAVENOUS | Status: DC
  Filled 2018-01-14 (×2): qty 1

## 2018-01-14 MED ORDER — TAMSULOSIN HCL 0.4 MG PO CAPS
0.4000 mg | ORAL_CAPSULE | Freq: Every day | ORAL | Status: DC
Start: 2018-01-14 — End: 2018-01-16
  Administered 2018-01-14 – 2018-01-16 (×3): 0.4 mg via ORAL
  Filled 2018-01-14 (×3): qty 1

## 2018-01-14 MED ORDER — FERUMOXYTOL INJECTION 510 MG/17 ML
510.0000 mg | Freq: Once | INTRAVENOUS | Status: DC
  Filled 2018-01-14: qty 17

## 2018-01-14 MED ORDER — SODIUM CHLORIDE 0.9 % IV SOLN
1.0000 g | Freq: Three times a day (TID) | INTRAVENOUS | Status: DC
  Administered 2018-01-14 – 2018-01-16 (×6): 1 g via INTRAVENOUS
  Filled 2018-01-14 (×9): qty 1

## 2018-01-14 MED ORDER — POTASSIUM CHLORIDE CRYS ER 20 MEQ PO TBCR
40.0000 meq | EXTENDED_RELEASE_TABLET | Freq: Once | ORAL | Status: AC
  Administered 2018-01-14: 40 meq via ORAL
  Filled 2018-01-14: qty 2

## 2018-01-14 MED ORDER — SODIUM CHLORIDE 0.9 % IV SOLN: Freq: Once | INTRAVENOUS | Status: DC

## 2018-01-14 MED ORDER — ACETAMINOPHEN 500 MG PO TABS: 500.0000 mg | ORAL_TABLET | Freq: Once | ORAL | Status: DC

## 2018-01-14 MED ORDER — SODIUM CHLORIDE 0.9 % IV SOLN
510.0000 mg | Freq: Once | INTRAVENOUS | Status: AC
  Administered 2018-01-14: 10:00:00 510 mg via INTRAVENOUS
  Filled 2018-01-14: qty 17

## 2018-01-14 NOTE — Evaluation (Signed)
Physical Therapy Evaluation Patient Details Name: Knoah Nedeau MRN: [id number] DOB: [birth year] Today's Date: 01/14/2018   History of Present Illness  Pt is a 83 y/o M who presented from SNF due to confusion and low hemoglobin level.  Pt was found to have a UTI and hemoglobin of 7.7.  PT with recent R hip IM nail on 01/06/18.  Pt's PMH includes dementia, insomnia.      Clinical Impression  Pt admitted with above diagnosis. Pt currently with functional limitations due to the deficits listed below (see PT Problem List). Mr. Milby presents from SNF where he does confirm that he was ambulating with therapy "but not much".  He currently requires min assist for bed mobility, mod assist for sit<>stand, and min assist when ambulating a short distance in his room.  He was limited by fatigue this session.  Given pt's current mobility status, recommending SNF at d/c.  Pt will benefit from skilled PT to increase their independence and safety with mobility to allow discharge to the venue listed below.      Follow Up Recommendations SNF    Equipment Recommendations  Other (comment)(TBD at next venue of care)    Recommendations for Other Services       Precautions / Restrictions Precautions Precautions: Fall Restrictions Weight Bearing Restrictions: Yes RLE Weight Bearing: Weight bearing as tolerated      Mobility  Bed Mobility Overal bed mobility: Needs Assistance Bed Mobility: Supine to Sit     Supine to sit: Min assist;HOB elevated     General bed mobility comments: Cues for sequencing with LEs and to scoot buttocks closer to EOB.  Assist to bring BLEs to EOB and assist to elevate trunk.    Transfers Overall transfer level: Needs assistance Equipment used: Rolling walker (2 wheeled) Transfers: Sit to/from Stand Sit to Stand: Mod assist;From elevated surface         General transfer comment: Slightly elevated bed for ease with sit>stand.  Pt requires mod assist to boost to standing  and places weight mainly through heels on BLEs.  Pt uses strength in BUEs to control descent to sit.   Ambulation/Gait Ambulation/Gait assistance: Min assist Ambulation Distance (Feet): 8 Feet Assistive device: Rolling walker (2 wheeled) Gait Pattern/deviations: Step-to pattern;Decreased stride length;Decreased stance time - right;Decreased step length - left;Decreased weight shift to right;Antalgic;Trunk flexed Gait velocity: decreased Gait velocity interpretation: Below normal speed for age/gender General Gait Details: Trunk flexed with cervical flexion looking down at floor.  Pt ambulates ~4 feet and becomes very fatigued with reports of feeling weak.  Therefore instructed pt to ambulate ~4 ft to chair to sit.    Stairs            Wheelchair Mobility    Modified Rankin (Stroke Patients Only)       Balance Overall balance assessment: Needs assistance Sitting-balance support: No upper extremity supported;Feet supported Sitting balance-Leahy Scale: Fair     Standing balance support: Bilateral upper extremity supported;During functional activity Standing balance-Leahy Scale: Poor Standing balance comment: Relies on UE support for static and dynamic activities                             Pertinent Vitals/Pain Pain Assessment: Faces Faces Pain Scale: Hurts even more Pain Location: R hip with mobility Pain Descriptors / Indicators: Grimacing;Guarding;Aching Pain Intervention(s): Limited activity within patient's tolerance;Monitored during session;Repositioned;Premedicated before session    Home Living Family/patient expects to be discharged to::  Skilled nursing facility                      Prior Function           Comments: unable to gather a lot of history secondary to confusion     Hand Dominance        Extremity/Trunk Assessment   Upper Extremity Assessment Upper Extremity Assessment: (BUE strength grossly 4-/5)    Lower Extremity  Assessment Lower Extremity Assessment: RLE deficits/detail RLE Deficits / Details: Limited ROM and strength as expected in RLE.  Pt requires assist for hip abduction and SLR exercises       Communication   Communication: No difficulties  Cognition Arousal/Alertness: Awake/alert Behavior During Therapy: WFL for tasks assessed/performed Overall Cognitive Status: History of cognitive impairments - at baseline                                 General Comments: Very pleasantly confused      General Comments      Exercises Total Joint Exercises Ankle Circles/Pumps: AROM;Both;10 reps;Supine Quad Sets: Strengthening;Both;10 reps;Supine Heel Slides: AAROM;Strengthening;Right;10 reps;Supine Hip ABduction/ADduction: AAROM;Strengthening;Right;10 reps;Supine Straight Leg Raises: AAROM;Strengthening;Right;10 reps;Supine   Assessment/Plan    PT Assessment Patient needs continued PT services  PT Problem List Decreased strength;Decreased range of motion;Decreased activity tolerance;Decreased balance;Decreased mobility;Decreased cognition;Decreased knowledge of use of DME;Decreased safety awareness;Pain       PT Treatment Interventions DME instruction;Gait training;Stair training;Therapeutic activities;Functional mobility training;Therapeutic exercise;Balance training;Neuromuscular re-education;Cognitive remediation;Patient/family education;Wheelchair mobility training;Manual techniques    PT Goals (Current goals can be found in the Care Plan section)  Acute Rehab PT Goals Patient Stated Goal: unable to state PT Goal Formulation: Patient unable to participate in goal setting Time For Goal Achievement: 01/28/18 Potential to Achieve Goals: Fair    Frequency Min 2X/week   Barriers to discharge        Co-evaluation               AM-PAC PT "6 Clicks" Daily Activity  Outcome Measure Difficulty turning over in bed (including adjusting bedclothes, sheets and blankets)?:  Unable Difficulty moving from lying on back to sitting on the side of the bed? : Unable Difficulty sitting down on and standing up from a chair with arms (e.g., wheelchair, bedside commode, etc,.)?: Unable Help needed moving to and from a bed to chair (including a wheelchair)?: A Lot Help needed walking in hospital room?: A Little Help needed climbing 3-5 steps with a railing? : Total 6 Click Score: 9    End of Session Equipment Utilized During Treatment: Gait belt Activity Tolerance: Patient tolerated treatment well;Patient limited by fatigue Patient left: in chair;with call bell/phone within reach;with chair alarm set Nurse Communication: Mobility status PT Visit Diagnosis: Pain;Unsteadiness on feet (R26.81);Other abnormalities of gait and mobility (R26.89);Muscle weakness (generalized) (M62.81);Difficulty in walking, not elsewhere classified (R26.2) Pain - Right/Left: Right Pain - part of body: Hip    Time: [id number] PT Time Calculation (min) (ACUTE ONLY): 25 min   Charges:   PT Evaluation $PT Eval Low Complexity: 1 Low PT Treatments $Therapeutic Exercise: 8-22 mins   PT G Codes:        Encarnacion ChuAshley Garnetta Fedrick PT, DPT 01/14/2018, 9:09 AM

## 2018-01-14 NOTE — Progress Notes (Signed)
Advance care planning  Patient has dementia.  Discussed with daughter who is the healthcare power of attorney , Trevor Porter.  Patient has dementia, anemia, recurrent UTIs.  We discussed that patient has acute encephalopathy over his baseline dementia due to UTI.  Obtain consent for his blood transfusion.  Explained that his recurrent UTIs are likely due to decreased mobility but also possible underlying prostate issues.  Started Flomax.  Explained that he likely has multidrug-resistant organism with ESBL E. coli.  Will need fosfomycin at discharge.  Waiting for final culture results.  Patient is DO NOT RESUSCITATE DO NOT INTUBATE.  Discussed regarding patient's long-term poor prognosis due to recurrent infections and recent fracture and decreased mobility.   Time spent 20 minutes.

## 2018-01-14 NOTE — Progress Notes (Addendum)
OT Cancellation Note  Patient Details Name: Trevor GlassingJohn Porter MRN: [id number] DOB: 1935/06/25   Cancelled Treatment:    Reason Eval/Treat Not Completed: Medical issues which prohibited therapy. Pt receiving blood transfusion this pm. Will re-attempt OT evaluation next date as pt is available and medically appropriate.   Richrd PrimeJamie Stiller, MPH, MS, OTR/L ascom [phone] 01/14/18, 4:12 PM

## 2018-01-14 NOTE — Progress Notes (Signed)
OT Cancellation Note  Patient Details Name: Trevor GlassingJohn Porter MRN: [id number] DOB: 21-Apr-1935   Cancelled Treatment:    Reason Eval/Treat Not Completed: Patient declined, no reason specified. Order received, chart reviewed. Pt sleeping upon attempt, easily wakes and immediately reports "not feeling good" and with minimal questioning pt is able to tell OT he has a headache and shoulder pain and is very tired. OT facilitated repositioning in bed. Spoke with RN about pt's reported pain. RN notes pt recently worked with PT and [facility] and just got back to bed and is likely very worn out. Will re-attempt OT evaluation at later time as pt is able to participate and is medically appropriate.   Richrd PrimeJamie Stiller, MPH, MS, OTR/L ascom [phone] 01/14/18, 10:54 AM

## 2018-01-14 NOTE — Plan of Care (Signed)
  Problem: Education: Goal: Knowledge of General Education information will improve Outcome: Progressing   Problem: Health Behavior/Discharge Planning: Goal: Ability to manage health-related needs will improve Outcome: Progressing   Problem: Clinical Measurements: Goal: Ability to maintain clinical measurements within normal limits will improve Outcome: Progressing Goal: Will remain free from infection Outcome: Progressing Goal: Diagnostic test results will improve Outcome: Progressing Goal: Cardiovascular complication will be avoided Outcome: Progressing   Problem: Nutrition: Goal: Adequate nutrition will be maintained Outcome: Progressing   Problem: Elimination: Goal: Will not experience complications related to bowel motility Outcome: Progressing Goal: Will not experience complications related to urinary retention Outcome: Progressing   Problem: Pain Managment: Goal: General experience of comfort will improve Outcome: Progressing   Problem: Safety: Goal: Ability to remain free from injury will improve Outcome: Progressing   Problem: Skin Integrity: Goal: Risk for impaired skin integrity will decrease Outcome: Progressing   Problem: Urinary Elimination: Goal: Signs and symptoms of infection will decrease Outcome: Progressing

## 2018-01-14 NOTE — Progress Notes (Signed)
[facility] - [place] at [facility]   PATIENT NAME: Trevor GlassingJohn Addis    MR#:  [id number]  DATE OF BIRTH:  1935-10-18  SUBJECTIVE:  CHIEF COMPLAINT:   Chief Complaint  Patient presents with  . Altered Mental Status  . Urinary Tract Infection   Patient is awake.  Pleasantly confused.  REVIEW OF SYSTEMS:    Review of Systems  Unable to perform ROS: Dementia    DRUG ALLERGIES:   Allergies  Allergen Reactions  . Penicillins     Has patient had a PCN reaction causing immediate rash, facial/tongue/throat swelling, SOB or lightheadedness with hypotension: Unknown Has patient had a PCN reaction causing severe rash involving mucus membranes or skin necrosis: Unknown Has patient had a PCN reaction that required hospitalization: Unknown Has patient had a PCN reaction occurring within the last 10 years: Unknown  Reaction is unknown. If all of the above answers are "NO", then may proceed with Cephalosporin use.    VITALS:  Blood pressure 121/68, pulse 76, temperature 97.8 F (36.6 C), temperature source Oral, resp. rate 18, height 5\' 11"  (1.803 m), weight 70.2 kg (154 lb 11.2 oz), SpO2 98 %.  PHYSICAL EXAMINATION:   Physical Exam  GENERAL:  83 y.o.-year-old patient lying in the bed with no acute distress.  EYES: Pupils equal, round, reactive to light and accommodation. No scleral icterus. Extraocular muscles intact.  HEENT: Head atraumatic, normocephalic. Oropharynx and nasopharynx clear.  NECK:  Supple, no jugular venous distention. No thyroid enlargement, no tenderness.  LUNGS: Normal breath sounds bilaterally, no wheezing, rales, rhonchi. No use of accessory muscles of respiration.  CARDIOVASCULAR: S1, S2 normal. No murmurs, rubs, or gallops.  ABDOMEN: Soft, nontender, nondistended. Bowel sounds present. No organomegaly or mass.  EXTREMITIES: No cyanosis, clubbing or edema b/l.    NEUROLOGIC: Cranial nerves II through XII are intact. No focal Motor or sensory  deficits b/l.   PSYCHIATRIC: The patient is alert and awake SKIN: Right hip dressing  LABORATORY PANEL:   CBC Recent Labs  Lab 01/14/18 0744  WBC 7.9  HGB 7.3*  HCT 22.5*  PLT 189   ------------------------------------------------------------------------------------------------------------------ Chemistries  Recent Labs  Lab 01/13/18 1942 01/14/18 0744  NA 132* 133*  K 3.4* 3.1*  CL 97* 102  CO2 25 23  GLUCOSE 117* 98  BUN 14 13  CREATININE 0.83 0.81  CALCIUM 8.5* 8.0*  AST 22  --   ALT 9*  --   ALKPHOS 74  --   BILITOT 1.7*  --    ------------------------------------------------------------------------------------------------------------------  Cardiac Enzymes Recent Labs  Lab 01/13/18 1942  TROPONINI <0.03   ------------------------------------------------------------------------------------------------------------------  RADIOLOGY:  No results found.   ASSESSMENT AND PLAN:   *UTI with acute metabolic encephalopathy over baseline dementia Improving.  Continue IV antibiotics.  Waiting for urine cultures.  Likely ESBL E. coli as in prior urine cultures.  *Iron deficiency anemia secondary to recent hip surgery.  Status post IV iron 1 dose.  Will transfuse 1 unit of packed RBC.  Consent obtained from daughter.  Repeat labs in the morning.  *  Status post recent right hip surgery.  Will have PT/OT evaluate the patient. Will repeat x-ray of the right hip.  *  Hypertension, stable, will start home medications.  Likely discharge in the morning back to skilled nursing facility.  All the records are reviewed and case discussed with Care Management/Social Worker Management plans discussed with the patient, family and they are in agreement.  CODE STATUS: DNR  DVT  Prophylaxis: SCDs  TOTAL TIME TAKING CARE OF THIS PATIENT: 30 minutes.   POSSIBLE D/C IN 1-2 DAYS, DEPENDING ON CLINICAL CONDITION.  Molinda Bailiff Dhruvi Crenshaw M.D on 01/14/2018 at 2:43 PM  Between 7am to  6pm - Pager - [phone]  After 6pm go to [email or web address] - password [email or web address]  [facility] [place] [facility]  Office  [phone]  CC: Primary care physician; Patient, No Pcp Per  Note: This dictation was prepared with Dragon dictation along with smaller phrase technology. Any transcriptional errors that result from this process are unintentional.

## 2018-01-15 LAB — CBC WITH DIFFERENTIAL/PLATELET
BASOS ABS: 0.1 10*3/uL (ref 0–0.1)
Basophils Relative: 1 %
EOS PCT: 2 %
Eosinophils Absolute: 0.1 10*3/uL (ref 0–0.7)
HCT: 24.6 % — ABNORMAL LOW (ref 40.0–52.0)
Hemoglobin: 7.9 g/dL — ABNORMAL LOW (ref 13.0–18.0)
LYMPHS ABS: 0.9 10*3/uL — AB (ref 1.0–3.6)
Lymphocytes Relative: 10 %
MCH: 26.3 pg (ref 26.0–34.0)
MCHC: 32.3 g/dL (ref 32.0–36.0)
MCV: 81.3 fL (ref 80.0–100.0)
MONO ABS: 0.5 10*3/uL (ref 0.2–1.0)
MONOS PCT: 6 %
NEUTROS ABS: 7 10*3/uL — AB (ref 1.4–6.5)
Neutrophils Relative %: 81 %
PLATELETS: 230 10*3/uL (ref 150–440)
RBC: 3.02 MIL/uL — ABNORMAL LOW (ref 4.40–5.90)
RDW: 16.3 % — ABNORMAL HIGH (ref 11.5–14.5)
WBC: 8.5 10*3/uL (ref 3.8–10.6)

## 2018-01-15 LAB — BASIC METABOLIC PANEL
ANION GAP: 10 (ref 5–15)
BUN: 12 mg/dL (ref 6–20)
CHLORIDE: 103 mmol/L (ref 101–111)
CO2: 22 mmol/L (ref 22–32)
Calcium: 8.6 mg/dL — ABNORMAL LOW (ref 8.9–10.3)
Creatinine, Ser: 0.79 mg/dL (ref 0.61–1.24)
GFR calc Af Amer: >60 mL/min (ref >60)
GFR calc non Af Amer: >60 mL/min (ref >60)
GLUCOSE: 105 mg/dL — AB (ref 65–99)
POTASSIUM: 3.6 mmol/L (ref 3.5–5.1)
Sodium: 135 mmol/L (ref 135–145)

## 2018-01-15 LAB — GLUCOSE, CAPILLARY: GLUCOSE-CAPILLARY: 91 mg/dL (ref 65–99)

## 2018-01-15 NOTE — Progress Notes (Signed)
Physical Therapy Treatment Patient Details Name: Trevor Porter MRN: Ferd [id number] DOB: 25-May-1935 Today's Date: 01/15/2018    History of Present Illness Pt is a 83 y/o M who presented from SNF due to confusion and low hemoglobin level.  Pt was found to have a UTI and hemoglobin of 7.7.  PT with recent R hip IM nail on 01/06/18.  Pt's PMH includes dementia, insomnia.      PT Comments    Pt is making gradual progress towards goals, however limited secondary to confusion. Pt able to perform there-ex and ambulate towards chair, however needs sequencing and tactile cues for use of RW. Very pleasantly confused. Therapist opened blinds and pt reports seeing [place] on the roof. Will continue to monitor and progress. Of note, pt with R pinky toe nail coming off, makes it difficult to don socks.   Follow Up Recommendations  SNF     Equipment Recommendations  Other (comment)    Recommendations for Other Services       Precautions / Restrictions Precautions Precautions: Fall Restrictions Weight Bearing Restrictions: Yes RLE Weight Bearing: Weight bearing as tolerated    Mobility  Bed Mobility Overal bed mobility: Needs Assistance Bed Mobility: Supine to Sit     Supine to sit: Min assist;HOB elevated     General bed mobility comments: needs assist to slide B LEs off bed. Able to initiate trunkal elevation  Transfers Overall transfer level: Needs assistance Equipment used: Rolling walker (2 wheeled) Transfers: Sit to/from Stand Sit to Stand: Min assist         General transfer comment: able to stand from lower bed surface. RW used for assistance. Upright standing noted.  Ambulation/Gait Ambulation/Gait assistance: Min assist Ambulation Distance (Feet): 3 Feet Assistive device: Rolling walker (2 wheeled) Gait Pattern/deviations: Step-to pattern;Decreased stride length;Decreased stance time - right;Decreased step length - left;Decreased weight shift to right;Antalgic;Trunk flexed      General Gait Details: trunk flexion noted with shuffled steps towards recliner. Has difficulty following commands for sequencing and using RW.    Stairs            Wheelchair Mobility    Modified Rankin (Stroke Patients Only)       Balance                                            Cognition Arousal/Alertness: Awake/alert(sleeping upon arrival, awakens easily) Behavior During Therapy: WFL for tasks assessed/performed Overall Cognitive Status: History of cognitive impairments - at baseline                                 General Comments: Very pleasantly confused      Exercises Other Exercises Other Exercises: supine/seated ther-ex performed on R LE including heel slides, ankle pumps, and LAQ. All ther-ex performed x 10 reps with min assist and cues for encouragement.    General Comments        Pertinent Vitals/Pain Pain Assessment: No/denies pain    Home Living                      Prior Function            PT Goals (current goals can now be found in the care plan section) Acute Rehab PT Goals Patient Stated Goal: unable to state PT  Goal Formulation: Patient unable to participate in goal setting Time For Goal Achievement: 01/28/18 Potential to Achieve Goals: Fair Progress towards PT goals: Progressing toward goals    Frequency    Min 2X/week      PT Plan Current plan remains appropriate    Co-evaluation              AM-PAC PT "6 Clicks" Daily Activity  Outcome Measure  Difficulty turning over in bed (including adjusting bedclothes, sheets and blankets)?: Unable Difficulty moving from lying on back to sitting on the side of the bed? : Unable Difficulty sitting down on and standing up from a chair with arms (e.g., wheelchair, bedside commode, etc,.)?: Unable Help needed moving to and from a bed to chair (including a wheelchair)?: A Little Help needed walking in hospital room?: A Little Help  needed climbing 3-5 steps with a railing? : A Lot 6 Click Score: 11    End of Session Equipment Utilized During Treatment: Gait belt Activity Tolerance: Patient tolerated treatment well Patient left: in chair;with call bell/phone within reach;with chair alarm set Nurse Communication: Mobility status PT Visit Diagnosis: Pain;Unsteadiness on feet (R26.81);Other abnormalities of gait and mobility (R26.89);Muscle weakness (generalized) (M62.81);Difficulty in walking, not elsewhere classified (R26.2) Pain - Right/Left: Right Pain - part of body: Hip     Time: 1610-9604 PT Time Calculation (min) (ACUTE ONLY): 24 min  Charges:  $Gait Training: 8-22 mins $Therapeutic Exercise: 8-22 mins                    G Codes:       Elizabeth Palau, PT, DPT [phone]    Trevor Porter 01/15/2018, 4:28 PM

## 2018-01-15 NOTE — Clinical Social Work Note (Signed)
Clinical Social Work Assessment  Patient Details  Name: Trevor GlassingJohn Porter MRN: [id number] Date of Birth: 10-11-1935  Date of referral:  01/15/18               Reason for consult:  Facility Placement                Permission sought to share information with:  Facility Medical sales representativeContact Representative Permission granted to share information::  Yes, Verbal Permission Granted  Name::     Elberta LeatherwoodFIELDS, MARY Daughter   [phone]   Agency::  SNF admissions  Relationship::     Contact Information:     Housing/Transportation Living arrangements for the past 2 months:  Skilled Nursing Facility Source of Information:  Patient Patient Interpreter Needed:  None Criminal Activity/Legal Involvement Pertinent to Current Situation/Hospitalization:    Significant Relationships:  Adult Children Lives with:  Facility Resident Do you feel safe going back to the place where you live?  Yes Need for family participation in patient care:  Yes (Comment)  Care giving concerns:  Patient did not express any concerns about returning back to [facility].   Social Worker assessment / plan:  Patient is an 83 year old male who is a [facility] care [facility] [place].  Patient has been there for several years per SNF.  Patient was explained role of CSW and process for getting him back to SNF.  Patient did not express any other questions or concerns.  CSW sent required clinical information to SNF.  Employment status:  Disabled (Comment on whether or not currently receiving Disability) Insurance information:  Medicare, VA Benefit PT Recommendations:  Skilled Nursing Facility Information / Referral to community resources:  Skilled Nursing Facility  Patient/Family's Response to care:  Patient plans to [facility].   Patient/Family's Understanding of and Emotional Response to Diagnosis, Current Treatment, and Prognosis:  Patient is aware of current treatment plan and  prognosis.  Emotional Assessment Appearance:  Appears stated age Attitude/Demeanor/Rapport:    Affect (typically observed):  Appropriate, Calm, Stable Orientation:  Oriented to Self Alcohol / Substance use:  Not Applicable Psych involvement (Current and /or in the community):  No (Comment)  Discharge Needs  Concerns to be addressed:  Care Coordination Readmission within the last 30 days:  Yes(01-08-18 to [facility]) Current discharge risk:  Cognitively Impaired, Chronically ill, Dependent with Mobility Barriers to Discharge:  Continued Medical Work up   [place] [employer], Marieanne Marxen R, LCSWA 01/15/2018, 6:09 PM

## 2018-01-15 NOTE — Progress Notes (Signed)
Spoke with Dr. Katheren ShamsSalary regarding blood transfusion. Blood transfusion on hold pending AM CBC results per MD. Will continue to monitor

## 2018-01-15 NOTE — Progress Notes (Signed)
[facility] [place] [facility]   PATIENT NAME: Trevor GlassingJohn Porter    MR#:  [id number]  DATE OF BIRTH:  17-Oct-1935  SUBJECTIVE:  CHIEF COMPLAINT:   Chief Complaint  Patient presents with  . Altered Mental Status  . Urinary Tract Infection   Patient is awake.  Pleasantly confused. Removed his gown.  Daughter gave history that patient had significant amount of bleeding from his surgical site after discharge.  Now resolved.  REVIEW OF SYSTEMS:    Review of Systems  Unable to perform ROS: Dementia    DRUG ALLERGIES:   Allergies  Allergen Reactions  . Penicillins     Has patient had a PCN reaction causing immediate rash, facial/tongue/throat swelling, SOB or lightheadedness with hypotension: Unknown Has patient had a PCN reaction causing severe rash involving mucus membranes or skin necrosis: Unknown Has patient had a PCN reaction that required hospitalization: Unknown Has patient had a PCN reaction occurring within the last 10 years: Unknown  Reaction is unknown. If all of the above answers are "NO", then may proceed with Cephalosporin use.    VITALS:  Blood pressure 131/67, pulse 75, temperature 98.8 F (37.1 C), temperature source Oral, resp. rate 18, height 5\' 11"  (1.803 m), weight 69.1 kg (152 lb 6.4 oz), SpO2 95 %.  PHYSICAL EXAMINATION:   Physical Exam  GENERAL:  83 y.o.-year-old patient lying in the bed with no acute distress.  EYES: Pupils equal, round, reactive to light and accommodation. No scleral icterus. Extraocular muscles intact.  HEENT: Head atraumatic, normocephalic. Oropharynx and nasopharynx clear.  NECK:  Supple, no jugular venous distention. No thyroid enlargement, no tenderness.  LUNGS: Normal breath sounds bilaterally, no wheezing, rales, rhonchi. No use of accessory muscles of respiration.  CARDIOVASCULAR: S1, S2 normal. No murmurs, rubs, or gallops.  ABDOMEN: Soft, nontender, nondistended. Bowel sounds present. No organomegaly or  mass.  EXTREMITIES: No cyanosis, clubbing or edema b/l.    NEUROLOGIC: Cranial nerves II through XII are intact. No focal Motor or sensory deficits b/l.   PSYCHIATRIC: The patient is alert and awake SKIN: Right hip dressing  LABORATORY PANEL:   CBC Recent Labs  Lab 01/15/18 0338  WBC 8.5  HGB 7.9*  HCT 24.6*  PLT 230   ------------------------------------------------------------------------------------------------------------------ Chemistries  Recent Labs  Lab 01/13/18 1942  01/15/18 0338  NA 132*   < > 135  K 3.4*   < > 3.6  CL 97*   < > 103  CO2 25   < > 22  GLUCOSE 117*   < > 105*  BUN 14   < > 12  CREATININE 0.83   < > 0.79  CALCIUM 8.5*   < > 8.6*  AST 22  --   --   ALT 9*  --   --   ALKPHOS 74  --   --   BILITOT 1.7*  --   --    < > = values in this interval not displayed.   ------------------------------------------------------------------------------------------------------------------  Cardiac Enzymes Recent Labs  Lab 01/13/18 1942  TROPONINI <0.03   ------------------------------------------------------------------------------------------------------------------  RADIOLOGY:  Dg Hip Unilat With Pelvis 2-3 Views Right  Result Date: 01/14/2018 CLINICAL DATA:  Status post right hip surgery 7 days ago. Patient reports right hip pain. EXAM: DG HIP (WITH OR WITHOUT PELVIS) 2-3V RIGHT COMPARISON:  Right hip series of January 05 2018 FINDINGS: The patient has undergone intramedullary rod and telescoping screw placement for fractures of the right femoral neck and its junction with  the femoral head. Radiographic positioning of the telescoping screw and intramedullary rod appears good. The fracture lines are faintly visible. The femoral head is appropriately position with respect to the acetabulum. IMPRESSION: No abnormality of the previously operated right hip. The telescoping screw and intramedullary rod appear to be appropriately positioned. Electronically Signed    By: David  [place] M.D.   On: 01/14/2018 15:55     ASSESSMENT AND PLAN:   * UTI with acute metabolic encephalopathy over baseline dementia Improving.  Continue IV antibiotics.  Waiting for urine cultures.  Likely ESBL E. coli as in prior urine cultures. If ESBL.  Will need 1 more dose of oral fosfomycin after finishing IV antibiotics in the hospital for 3 days total.  *Iron deficiency anemia secondary to recent hip surgery.  Status post IV iron 1 dose.   Hemoglobin at 7.9 today.  No need for transfusion. Transfuse if less than 7.  *  Status post recent right hip surgery.  Will have PT/OT evaluate the patient. Repeat x-ray shows good alignment. No bleeding from surgical site at this time per  *  Hypertension, stable, will start home medications.  Discharge back to skilled nursing facility when cultures available  All the records are reviewed and case discussed with Care Management/Social Worker Management plans discussed with the patient, family and they are in agreement.  CODE STATUS: DNR  DVT Prophylaxis: SCDs  TOTAL TIME TAKING CARE OF THIS PATIENT: 30 minutes.   POSSIBLE D/C IN 1-2 DAYS, DEPENDING ON CLINICAL CONDITION.  Molinda Bailiff Sokhna Christoph M.D on 01/15/2018 at 2:59 PM  Between 7am to 6pm - Pager - [phone]  After 6pm go to [email or web address] - password [email or web address]  [facility]  Office  [phone]  CC: Primary care physician; Patient, No Pcp Per  Note: This dictation was prepared with Dragon dictation along with smaller phrase technology. Any transcriptional errors that result from this process are unintentional.

## 2018-01-15 NOTE — NC FL2 (Signed)
[place] MEDICAID FL2 LEVEL OF CARE SCREENING TOOL     IDENTIFICATION  Patient Name: Ferd GlassingJohn Thurow Birthdate: January 18, 1935 Sex: male Admission Date (Current Location): 01/13/2018  [place]   Facility and Address:  [facility], [address], [place], [place] [address]      Provider Number: [id number]  Attending Physician Name and Address:  Milagros LollSudini, Srikar, MD  Relative Name and Phone Number:       Current Level of Care: Hospital Recommended Level of Care: Skilled Nursing Facility Prior Approval Number:    Date Approved/Denied:   PASRR Number: [id number]  Discharge Plan: SNF    Current Diagnoses: Patient Active Problem List   Diagnosis Date Noted  . Acute encephalopathy 01/13/2018  . Closed right hip fracture (HCC) 01/06/2018  . AF (paroxysmal atrial fibrillation) (HCC) 01/06/2018  . HTN (hypertension) 01/06/2018  . Insomnia 01/06/2018    Orientation RESPIRATION BLADDER Height & Weight     Self, Situation  Normal Incontinent Weight: 152 lb 6.4 oz (69.1 kg) Height:  5\' 11"  (180.3 cm)  BEHAVIORAL SYMPTOMS/MOOD NEUROLOGICAL BOWEL NUTRITION STATUS      Incontinent Diet(Cardiac diet)  AMBULATORY STATUS COMMUNICATION OF NEEDS Skin   Limited Assist Verbally Surgical wounds                       Personal Care Assistance Level of Assistance  Bathing, Feeding, Dressing Bathing Assistance: Limited assistance Feeding assistance: Independent Dressing Assistance: Limited assistance     Functional Limitations Info  Sight, Hearing, Speech Sight Info: Adequate Hearing Info: Adequate Speech Info: Adequate    SPECIAL CARE FACTORS FREQUENCY  PT (By licensed PT)     PT Frequency: 5x a week              Contractures      Additional Factors Info  Code Status, Allergies, Psychotropic Code Status Info: DNR Allergies Info: Penicillins Psychotropic Info: traZODone (DESYREL) tablet 50 mg           Current Medications (01/15/2018):  This is the current hospital active medication list Current Facility-Administered Medications  Medication Dose Route Frequency Provider Last Rate Last Dose  . acetaminophen (TYLENOL) tablet 650 mg  650 mg Oral Q6H PRN Cammy CopaMaier, Angela, MD       Or  . acetaminophen (TYLENOL) suppository 650 mg  650 mg Rectal Q6H PRN Cammy CopaMaier, Angela, MD      . acetaminophen (TYLENOL) tablet 500 mg  500 mg Oral Once Bertrum SolSalary, Montell D, MD   Stopped at 01/15/18 0315  . bisacodyl (DULCOLAX) EC tablet 5 mg  5 mg Oral Daily PRN Cammy CopaMaier, Angela, MD      . carvedilol (COREG) tablet 25 mg  25 mg Oral BID WC Cammy CopaMaier, Angela, MD   25 mg at 01/15/18 1143  . diphenhydrAMINE (BENADRYL) capsule 25 mg  25 mg Oral Q6H PRN Salary, Montell D, MD      . docusate sodium (COLACE) capsule 100 mg  100 mg Oral BID Cammy CopaMaier, Angela, MD   100 mg at 01/15/18 1143  . HYDROcodone-acetaminophen (NORCO/VICODIN) 5-325 MG per tablet 1-2 tablet  1-2 tablet Oral Q4H PRN Cammy CopaMaier, Angela, MD   1 tablet at 01/15/18 0325  . meropenem (MERREM) 1 g in sodium chloride 0.9 % 100 mL IVPB  1 g Intravenous Q8H Cammy CopaMaier, Angela, MD   Stopped at 01/15/18 1200  . ondansetron (ZOFRAN) tablet 4 mg  4 mg Oral Q6H PRN Cammy CopaMaier, Angela, MD  Or  . ondansetron (ZOFRAN) injection 4 mg  4 mg Intravenous Q6H PRN Cammy Copa, MD      . tamsulosin [facility]) capsule 0.4 mg  0.4 mg Oral Daily Milagros Loll, MD   0.4 mg at 01/15/18 1143  . traMADol (ULTRAM) tablet 50 mg  50 mg Oral BID Cammy Copa, MD   50 mg at 01/15/18 1143  . traZODone (DESYREL) tablet 25 mg  25 mg Oral QHS PRN Cammy Copa, MD      . traZODone (DESYREL) tablet 50 mg  50 mg Oral QHS Cammy Copa, MD   50 mg at 01/14/18 2002     Discharge Medications: Please see discharge summary for a list of discharge medications.  Relevant Imaging Results:  Relevant Lab Results:   Additional Information SSN [id number]  Darleene Cleaver, [place]

## 2018-01-15 NOTE — Progress Notes (Signed)
OT Cancellation Note  Patient Details Name: Trevor GlassingJohn Porter MRN: [id number] DOB: 11-10-1934   Cancelled Treatment:    Reason Eval/Treat Not Completed: Patient declined, no reason specified(Pt. supine in bed with eyes closed. Upon arrival, pt.'s gown was completely off, and resting next to him. Assistance was offered to donn the gown, pt. refused the assistance, and refused to wear the gown. Pt. refused OT services at this time. Pt. Was pleasantly confused keeping his eyes closed. When offered to have OT reattempt at a later time, pt. gave a Thumbs up gesture. Will reattempt OT services at a later time/date as time allows.  Olegario MessierElaine Elisabel Hanover, MS, OTR/L 01/15/2018, 11:43 AM

## 2018-01-15 NOTE — Plan of Care (Signed)
  Problem: Education: Goal: Knowledge of General Education information will improve Outcome: Progressing Note:  Spoke with pts dtr n phone re pt    Problem: Health Behavior/Discharge Planning: Goal: Ability to manage health-related needs will improve Outcome: Progressing   Problem: Clinical Measurements: Goal: Ability to maintain clinical measurements within normal limits will improve Outcome: Progressing Goal: Will remain free from infection Outcome: Progressing Goal: Diagnostic test results will improve Outcome: Progressing Goal: Cardiovascular complication will be avoided Outcome: Progressing   Problem: Nutrition: Goal: Adequate nutrition will be maintained Outcome: Progressing   Problem: Elimination: Goal: Will not experience complications related to bowel motility Outcome: Progressing Goal: Will not experience complications related to urinary retention Outcome: Progressing   Problem: Pain Managment: Goal: General experience of comfort will improve Outcome: Progressing   Problem: Safety: Goal: Ability to remain free from injury will improve Outcome: Progressing   Problem: Skin Integrity: Goal: Risk for impaired skin integrity will decrease Outcome: Progressing   Problem: Urinary Elimination: Goal: Signs and symptoms of infection will decrease Outcome: Progressing

## 2018-01-15 NOTE — Progress Notes (Signed)
OT Cancellation Note  Patient Details Name: Trevor GlassingJohn Mcmannis MRN: [id number] DOB: 03/29/1935   Cancelled Treatment:    Reason Eval/Treat Not Completed: Patient declined, no reason specified.  Pt complaining of fatigue and declined OT evaluation.  Will attempt again tomorrow.  Susanne BordersSusan Wofford, OTR/L ascom [phone] 01/15/18, 4:17 PM

## 2018-01-15 NOTE — Progress Notes (Signed)
Patient HGB is 7.9. Dr. Sheryle Hailiamond informed. Unit of blood will continue to be on hold. Will continue to monitor.

## 2018-01-16 LAB — HEMOGLOBIN

## 2018-01-16 LAB — CBC
HEMATOCRIT: 21.5 % — AB (ref 40.0–52.0)
Hemoglobin: 7.3 g/dL — ABNORMAL LOW (ref 13.0–18.0)
MCH: 27.1 pg (ref 26.0–34.0)
MCHC: 34 g/dL (ref 32.0–36.0)
MCV: 79.8 fL — ABNORMAL LOW (ref 80.0–100.0)
Platelets: 235 10*3/uL (ref 150–440)
RBC: 2.7 MIL/uL — ABNORMAL LOW (ref 4.40–5.90)
RDW: 16.3 % — AB (ref 11.5–14.5)
WBC: 8.2 10*3/uL (ref 3.8–10.6)

## 2018-01-16 LAB — URINE CULTURE

## 2018-01-16 MED ORDER — TRAMADOL HCL 50 MG PO TABS: 50.0000 mg | ORAL_TABLET | Freq: Two times a day (BID) | ORAL | 0 refills | Status: DC

## 2018-01-16 MED ORDER — NITROFURANTOIN MONOHYD MACRO 100 MG PO CAPS: 100.0000 mg | ORAL_CAPSULE | Freq: Two times a day (BID) | ORAL | 0 refills | Status: AC

## 2018-01-16 MED ORDER — BLISTEX MEDICATED EX OINT
TOPICAL_OINTMENT | CUTANEOUS | Status: DC | PRN
  Filled 2018-01-16: qty 6.3

## 2018-01-16 MED ORDER — TAMSULOSIN HCL 0.4 MG PO CAPS: 0.4000 mg | ORAL_CAPSULE | Freq: Every day | ORAL | 0 refills | Status: AC

## 2018-01-16 MED ORDER — NITROFURANTOIN MONOHYD MACRO 100 MG PO CAPS
100.0000 mg | ORAL_CAPSULE | Freq: Two times a day (BID) | ORAL | Status: DC
  Administered 2018-01-16: 10:00:00 100 mg via ORAL
  Filled 2018-01-16 (×2): qty 1

## 2018-01-16 NOTE — Discharge Planning (Signed)
Patient IV removed.  RN assessment and VS revealed stability for DC back [facility] ([facility] Discharge papers given placed in packet, signed and placed in chart.  Signed DNR golden rod and scripts also in packet.  Report called and s/w Sherrye PayorNicole Marsh, LPN. EMS contacted to transport and going to room 319.  Waiting on arrival.

## 2018-01-16 NOTE — Clinical Social Work Note (Signed)
The patient will discharge to [facility] today via non-emergent EMS. The patient's family and facility are aware and in agreement.The patient's daughter has requested that the attending RN call her and update as to discharge medications etc. The CSW will deliver the packet when able, at which time the CSW will sign off. Please consult should additional needs arise.  Argentina PonderKaren Martha Larsen Dungan, MSW, Theresia MajorsLCSWA [phone]

## 2018-01-16 NOTE — Discharge Summary (Signed)
[facility] [facility] [place] [facility]   PATIENT NAME: Trevor Porter    MR#:  [id number]  DATE OF BIRTH:  October 29, 1934  DATE OF ADMISSION:  01/13/2018 ADMITTING PHYSICIAN: Cammy Copa, MD  DATE OF DISCHARGE: 01/16/2018  PRIMARY CARE PHYSICIAN: Patient, No Pcp Per    ADMISSION DIAGNOSIS:  Delirium [R41.0] Acute cystitis with hematuria [N30.01] Anemia, unspecified type [D64.9]  DISCHARGE DIAGNOSIS:  Active Problems:   Acute encephalopathy   SECONDARY DIAGNOSIS:   Past Medical History:  Diagnosis Date  . Dementia   . HTN (hypertension)   . Insomnia   . Paroxysmal atrial fibrillation [facility])     HOSPITAL COURSE:   83 year old male with dementia, PAF and recent right hip surgery who presented with altered mental status.   1.  Acute metabolic encephalopathy in the setting of urinary tract infection with baseline dementia: Patient appears to be back at baseline  2.  E. coli urinary tract infection: This was sensitive to penicillins as well as Macrobid.  Patient has an unknown allergy to penicillin.  He is discharged on oral Macrobid.  3.  Acute on chronic anemia from hip surgery: Patient will stop Lovenox due to anemia.  He received IV iron and 1 unit PRBC. Aspirin stopped for now.   4.  Recent right hip surgery: Patient will need skilled nursing facility upon discharge  5.  Essential hypertension: Continue Coreg  6.  PAF: Heart rate is controlled  DISCHARGE CONDITIONS AND DIET:   Stable  Regular diet  CONSULTS OBTAINED:    DRUG ALLERGIES:   Allergies  Allergen Reactions  . Penicillins     Has patient had a PCN reaction causing immediate rash, facial/tongue/throat swelling, SOB or lightheadedness with hypotension: Unknown Has patient had a PCN reaction causing severe rash involving mucus membranes or skin necrosis: Unknown Has patient had a PCN reaction that required hospitalization: Unknown Has patient had a PCN reaction occurring within the last  10 years: Unknown  Reaction is unknown. If all of the above answers are "NO", then may proceed with Cephalosporin use.    DISCHARGE MEDICATIONS:   Allergies as of 01/16/2018      Reactions   Penicillins    Has patient had a PCN reaction causing immediate rash, facial/tongue/throat swelling, SOB or lightheadedness with hypotension: Unknown Has patient had a PCN reaction causing severe rash involving mucus membranes or skin necrosis: Unknown Has patient had a PCN reaction that required hospitalization: Unknown Has patient had a PCN reaction occurring within the last 10 years: Unknown Reaction is unknown. If all of the above answers are "NO", then may proceed with Cephalosporin use.      Medication List    STOP taking these medications   aspirin EC 81 MG tablet   cephALEXin 500 MG capsule Commonly known as:  KEFLEX   enoxaparin 40 MG/0.4ML injection Commonly known as:  LOVENOX   oxyCODONE 5 MG immediate release tablet Commonly known as:  Oxy IR/ROXICODONE     TAKE these medications   carvedilol 25 MG tablet Commonly known as:  COREG Take 1 tablet (25 mg total) by mouth 2 (two) times daily with a meal.   docusate sodium 100 MG capsule Commonly known as:  COLACE Take 1 capsule (100 mg total) by mouth 2 (two) times daily.   nitrofurantoin (macrocrystal-monohydrate) 100 MG capsule Commonly known as:  MACROBID Take 1 capsule (100 mg total) by mouth every 12 (twelve) hours for 10 days.   tamsulosin 0.4 MG Caps capsule  Commonly known as:  FLOMAX Take 1 capsule (0.4 mg total) by mouth daily. Start taking on:  01/17/2018   traMADol 50 MG tablet Commonly known as:  ULTRAM Take 1 tablet (50 mg total) by mouth 2 (two) times daily.   traZODone 50 MG tablet Commonly known as:  DESYREL Take 50 mg by mouth at bedtime.         Today   CHIEF COMPLAINT:   no issues over night   VITAL SIGNS:  Blood pressure (!) 144/63, pulse 78, temperature 98.2 F (36.8 C),  temperature source Oral, resp. rate 20, height 5\' 11"  (1.803 m), weight 68.4 kg (150 lb 11.2 oz), SpO2 94 %.   REVIEW OF SYSTEMS:  Review of Systems  Unable to perform ROS: Dementia     PHYSICAL EXAMINATION:  GENERAL:  83 y.o.-year-old patient lying in the bed with no acute distress.  NECK:  Supple, no jugular venous distention. No thyroid enlargement, no tenderness.  LUNGS: Normal breath sounds bilaterally, no wheezing, rales,rhonchi  No use of accessory muscles of respiration.  CARDIOVASCULAR: S1, S2 normal. No murmurs, rubs, or gallops.  ABDOMEN: Soft, non-tender, non-distended. Bowel sounds present. No organomegaly or mass.  EXTREMITIES: No pedal edema, cyanosis, or clubbing.  PSYCHIATRIC: The patient is alert and oriented x name SKIN: No obvious rash, lesion, or ulcer.   DATA REVIEW:   CBC Recent Labs  Lab 01/16/18 0449  WBC 8.2  HGB TEST WILL BE CREDITED  7.3*  HCT 21.5*  PLT 235    Chemistries  Recent Labs  Lab 01/13/18 1942  01/15/18 0338  NA 132*   < > 135  K 3.4*   < > 3.6  CL 97*   < > 103  CO2 25   < > 22  GLUCOSE 117*   < > 105*  BUN 14   < > 12  CREATININE 0.83   < > 0.79  CALCIUM 8.5*   < > 8.6*  AST 22  --   --   ALT 9*  --   --   ALKPHOS 74  --   --   BILITOT 1.7*  --   --    < > = values in this interval not displayed.    Cardiac Enzymes Recent Labs  Lab 01/13/18 1942  TROPONINI <0.03    Microbiology Results  @MICRORSLT48 @  RADIOLOGY:  Dg Hip Unilat With Pelvis 2-3 Views Right  Result Date: 01/14/2018 CLINICAL DATA:  Status post right hip surgery 7 days ago. Patient reports right hip pain. EXAM: DG HIP (WITH OR WITHOUT PELVIS) 2-3V RIGHT COMPARISON:  Right hip series of January 05 2018 FINDINGS: The patient has undergone intramedullary rod and telescoping screw placement for fractures of the right femoral neck and its junction with the femoral head. Radiographic positioning of the telescoping screw and intramedullary rod appears good.  The fracture lines are faintly visible. The femoral head is appropriately position with respect to the acetabulum. IMPRESSION: No abnormality of the previously operated right hip. The telescoping screw and intramedullary rod appear to be appropriately positioned. Electronically Signed   By: David  SwazilandJordan M.D.   On: 01/14/2018 15:55      Allergies as of 01/16/2018      Reactions   Penicillins    Has patient had a PCN reaction causing immediate rash, facial/tongue/throat swelling, SOB or lightheadedness with hypotension: Unknown Has patient had a PCN reaction causing severe rash involving mucus membranes or skin necrosis: Unknown Has patient had a  PCN reaction that required hospitalization: Unknown Has patient had a PCN reaction occurring within the last 10 years: Unknown Reaction is unknown. If all of the above answers are "NO", then may proceed with Cephalosporin use.      Medication List    STOP taking these medications   aspirin EC 81 MG tablet   cephALEXin 500 MG capsule Commonly known as:  KEFLEX   enoxaparin 40 MG/0.4ML injection Commonly known as:  LOVENOX   oxyCODONE 5 MG immediate release tablet Commonly known as:  Oxy IR/ROXICODONE     TAKE these medications   carvedilol 25 MG tablet Commonly known as:  COREG Take 1 tablet (25 mg total) by mouth 2 (two) times daily with a meal.   docusate sodium 100 MG capsule Commonly known as:  COLACE Take 1 capsule (100 mg total) by mouth 2 (two) times daily.   nitrofurantoin (macrocrystal-monohydrate) 100 MG capsule Commonly known as:  MACROBID Take 1 capsule (100 mg total) by mouth every 12 (twelve) hours for 10 days.   tamsulosin 0.4 MG Caps capsule Commonly known as:  FLOMAX Take 1 capsule (0.4 mg total) by mouth daily. Start taking on:  01/17/2018   traMADol 50 MG tablet Commonly known as:  ULTRAM Take 1 tablet (50 mg total) by mouth 2 (two) times daily.   traZODone 50 MG tablet Commonly known as:  DESYREL Take 50  mg by mouth at bedtime.          Management plans discussed with the patient and he is in agreement. Stable for discharge snf  Patient should follow up with ortho  CODE STATUS:     Code Status Orders  (From admission, onward)        Start     Ordered   01/13/18 2340  Do not attempt resuscitation (DNR)  Continuous    Question Answer Comment  In the event of cardiac or respiratory ARREST Do not call a "code blue"   In the event of cardiac or respiratory ARREST Do not perform Intubation, CPR, defibrillation or ACLS   In the event of cardiac or respiratory ARREST Use medication by any route, position, wound care, and other measures to relive pain and suffering. May use oxygen, suction and manual treatment of airway obstruction as needed for comfort.      01/13/18 2339    Code Status History    Date Active Date Inactive Code Status Order ID Comments User Context   01/06/2018 0842 01/08/2018 1345 DNR [id number]  Wilfred Lacy, RN Inpatient   01/06/2018 0508 01/06/2018 0842 Full Code [id number]  Oralia Manis, MD ED    Advance Directive Documentation     Most Recent Value  Type of Advance Directive  Healthcare Power of Attorney  Pre-existing out of facility DNR order (yellow form or pink MOST form)  -  "MOST" Form in Place?  -      TOTAL TIME TAKING CARE OF THIS PATIENT: 38 minutes.    Note: This dictation was prepared with Dragon dictation along with smaller phrase technology. Any transcriptional errors that result from this process are unintentional.  Linell Meldrum M.D on 01/16/2018 at 10:30 AM  Between 7am to 6pm - Pager - [phone] After 6pm go to [email or web address] - password [email or web address]  [facility]  Office  [phone]  CC: Primary care physician; Patient, No Pcp Per

## 2018-01-17 ENCOUNTER — Other Ambulatory Visit
Admission: RE | Admit: 2018-01-17 | Discharge: 2018-01-17 | Disposition: A | Discharge status: Home / Self Care | Payer: Medicare Other | Source: Other Acute Inpatient Hospital | Attending: Family Medicine | Admitting: Family Medicine

## 2018-01-17 DIAGNOSIS — Z029 Encounter for administrative examinations, unspecified: Secondary | ICD-10-CM | POA: Insufficient documentation

## 2018-01-17 LAB — TYPE AND SCREEN
ABO/RH(D): A POS
Antibody Screen: NEGATIVE
UNIT DIVISION: 0

## 2018-01-17 LAB — PREPARE RBC (CROSSMATCH)

## 2018-01-17 LAB — CBC WITH DIFFERENTIAL/PLATELET
BASOS ABS: 0.1 10*3/uL (ref 0–0.1)
BASOS PCT: 1 %
EOS ABS: 0.2 10*3/uL (ref 0–0.7)
EOS PCT: 3 %
HCT: 24 % — ABNORMAL LOW (ref 40.0–52.0)
Hemoglobin: 7.8 g/dL — ABNORMAL LOW (ref 13.0–18.0)
Lymphocytes Relative: 10 %
Lymphs Abs: 0.9 10*3/uL — ABNORMAL LOW (ref 1.0–3.6)
MCH: 26.7 pg (ref 26.0–34.0)
MCHC: 32.5 g/dL (ref 32.0–36.0)
MCV: 82.2 fL (ref 80.0–100.0)
MONO ABS: 0.7 10*3/uL (ref 0.2–1.0)
MONOS PCT: 8 %
Neutro Abs: 7.2 10*3/uL — ABNORMAL HIGH (ref 1.4–6.5)
Neutrophils Relative %: 78 %
PLATELETS: 238 10*3/uL (ref 150–440)
RBC: 2.91 MIL/uL — ABNORMAL LOW (ref 4.40–5.90)
RDW: 17.1 % — AB (ref 11.5–14.5)
WBC: 9.1 10*3/uL (ref 3.8–10.6)

## 2018-01-17 LAB — BPAM RBC
Blood Product Expiration Date: 201904082359
UNIT TYPE AND RH: 6200

## 2018-02-22 ENCOUNTER — Encounter: Payer: Self-pay | Admitting: *Deleted

## 2018-02-22 ENCOUNTER — Emergency Department: Payer: Medicare Other

## 2018-02-22 ENCOUNTER — Emergency Department
Admission: EM | Admit: 2018-02-22 | Discharge: 2018-02-23 | Disposition: A | Discharge status: Home / Self Care | Payer: Medicare Other | Attending: Emergency Medicine | Admitting: Emergency Medicine

## 2018-02-22 ENCOUNTER — Other Ambulatory Visit: Payer: Self-pay

## 2018-02-22 DIAGNOSIS — I10 Essential (primary) hypertension: Secondary | ICD-10-CM | POA: Diagnosis not present

## 2018-02-22 DIAGNOSIS — Z79899 Other long term (current) drug therapy: Secondary | ICD-10-CM | POA: Diagnosis not present

## 2018-02-22 DIAGNOSIS — R4182 Altered mental status, unspecified: Secondary | ICD-10-CM | POA: Diagnosis present

## 2018-02-22 DIAGNOSIS — Z7982 Long term (current) use of aspirin: Secondary | ICD-10-CM | POA: Insufficient documentation

## 2018-02-22 DIAGNOSIS — F0390 Unspecified dementia without behavioral disturbance: Secondary | ICD-10-CM | POA: Insufficient documentation

## 2018-02-22 NOTE — ED Triage Notes (Signed)
Pt reported to have AMS all day at care facility. Pt is slightly confused at this time.  Pt has hx of UTI's and smells strongly of urine. Pt denies pain at this time.

## 2018-02-22 NOTE — ED Provider Notes (Signed)
[facility] Emergency Department Provider Note   ____________________________________________   First MD Initiated Contact with Patient 02/22/18 2259     (approximate)  I have reviewed the triage vital signs and the nursing notes.   HISTORY  Chief Complaint Altered Mental Status and Recurrent UTI  Level 5 caveat: History limited by dementia  HPI Trevor Porter is a 83 y.o. male brought to the ED via EMS from care facility with a chief complaint of altered mental status.  Reportedly patient has a history of UTIs.  Patient tells me he is here against his will.  Denies fever, chills, chest pain, shortness of breath, abdominal pain, nausea, vomiting, diarrhea.  Does complain of occasional dysuria.  Denies recent travel or trauma.   Past Medical History:  Diagnosis Date  . Dementia   . HTN (hypertension)   . Insomnia   . Paroxysmal atrial fibrillation [facility])     Patient Active Problem List   Diagnosis Date Noted  . Acute encephalopathy 01/13/2018  . Closed right hip fracture (HCC) 01/06/2018  . AF (paroxysmal atrial fibrillation) (HCC) 01/06/2018  . HTN (hypertension) 01/06/2018  . Insomnia 01/06/2018    Past Surgical History:  Procedure Laterality Date  . INTRAMEDULLARY (IM) NAIL INTERTROCHANTERIC Right 01/06/2018   Procedure: INTRAMEDULLARY (IM) NAIL INTERTROCHANTRIC;  Surgeon: Signa Kell, MD;  Location: [facility] ORS;  Service: Orthopedics;  Laterality: Right;  . NO PAST SURGERIES      Prior to Admission medications   Medication Sig Start Date End Date Taking? Authorizing Provider  acetaminophen (TYLENOL) 325 MG tablet Take 975 mg by mouth every 8 (eight) hours.   Yes [provider]  aspirin 81 MG chewable tablet Chew 81 mg by mouth daily.   Yes [provider]  carvedilol (COREG) 6.25 MG tablet Take 6.25 mg by mouth 2 (two) times daily with a meal.   Yes [provider]  cholecalciferol (VITAMIN D) 1000 units tablet Take  1,000 Units by mouth daily.   Yes [provider]  ibuprofen (ADVIL,MOTRIN) 200 MG tablet Take 200 mg by mouth 2 (two) times daily as needed for mild pain.   Yes [provider]  lidocaine (LIDODERM) 5 % Place 1 patch onto the skin daily. Remove & Discard patch within 12 hours or as directed by MD   Yes [provider]  Melatonin 3 MG TABS Take 3 mg by mouth at bedtime.   Yes [provider]  oxyCODONE (OXY IR/ROXICODONE) 5 MG immediate release tablet Take 2.5 mg by mouth daily as needed for severe pain.   Yes [provider]  senna (SENOKOT) 8.6 MG TABS tablet Take 1 tablet by mouth 2 (two) times daily as needed for mild constipation.   Yes [provider]  tamsulosin (FLOMAX) 0.4 MG CAPS capsule Take 1 capsule (0.4 mg total) by mouth daily. Patient taking differently: Take 0.8 mg by mouth daily.  01/17/18  Yes Mody, Patricia Pesa, MD  traZODone (DESYREL) 50 MG tablet Take 50 mg by mouth at bedtime.   Yes [provider]  vitamin B-12 (CYANOCOBALAMIN) 1000 MCG tablet Take 1,000 mcg by mouth daily.   Yes [provider]  carvedilol (COREG) 25 MG tablet Take 1 tablet (25 mg total) by mouth 2 (two) times daily with a meal. Patient not taking: Reported on 02/23/2018 01/08/18   Enedina Finner, MD  docusate sodium (COLACE) 100 MG capsule Take 1 capsule (100 mg total) by mouth 2 (two) times daily. Patient not taking: Reported  on 02/23/2018 01/08/18   Enedina Finner, MD  traMADol (ULTRAM) 50 MG tablet Take 1 tablet (50 mg total) by mouth 2 (two) times daily. Patient not taking: Reported on 02/23/2018 01/16/18   Enid Baas, MD    Allergies Penicillins  Family History  Problem Relation Age of Onset  . Hypertension Other     Social History Social History   Tobacco Use  . Smoking status: Never Smoker  . Smokeless tobacco: Never Used  Substance Use Topics  . Alcohol use: No    Frequency: Never  . Drug use: No    Review of  Systems  Constitutional: No fever/chills. Eyes: No visual changes. ENT: No sore throat. Cardiovascular: Denies chest pain. Respiratory: Denies shortness of breath. Gastrointestinal: No abdominal pain.  No nausea, no vomiting.  No diarrhea.  No constipation. Genitourinary: Positive for dysuria. Musculoskeletal: Negative for back pain. Skin: Negative for rash. Neurological: Positive for altered mentation.  Negative for headaches, focal weakness or numbness.   ____________________________________________   PHYSICAL EXAM:  VITAL SIGNS: ED Triage Vitals  Enc Vitals Group     BP --      Pulse Rate 02/22/18 2255 75     Resp 02/22/18 2255 (!) 21     Temp 02/22/18 2255 98.1 F (36.7 C)     Temp Source 02/22/18 2255 Oral     SpO2 02/22/18 2250 95 %     Weight 02/22/18 2256 155 lb (70.3 kg)     Height 02/22/18 2256  (1.803 m)     Head Circumference --      Peak Flow --      Pain Score 02/22/18 2256 0     Pain Loc --      Pain Edu? --      Excl. in GC? --     Constitutional: Alert and oriented. Well appearing and in no acute distress. Eyes: Conjunctivae are normal. PERRL. EOMI. Head: Atraumatic. Nose: No congestion/rhinnorhea. Mouth/Throat: Mucous membranes are moist.  Oropharynx non-erythematous. Neck: No stridor.  Supple neck without meningismus. Cardiovascular: Normal rate, irregular rhythm. Grossly normal heart sounds.  Good peripheral circulation. Respiratory: Normal respiratory effort.  No retractions. Lungs CTAB. Gastrointestinal: Soft and nontender to light or deep palpation. No distention. No abdominal bruits. No CVA tenderness. Musculoskeletal: No lower extremity tenderness nor edema.  No joint effusions. Neurologic: Alert and oriented to person and place.  CN II-XII grossly intact.  Normal speech and language. No gross focal neurologic deficits are appreciated. MAEx4. Skin:  Skin is warm, dry and intact. No rash noted.  No petechiae. Psychiatric: Mood and  affect are normal. Speech and behavior are normal.  ____________________________________________   LABS (all labs ordered are listed, but only abnormal results are displayed)  Labs Reviewed  CBC WITH DIFFERENTIAL/PLATELET - Abnormal; Notable for the following components:      Result Value   RBC 4.22 (*)    Hemoglobin 10.9 (*)    HCT 34.1 (*)    MCH 25.9 (*)    MCHC 31.9 (*)    RDW 17.0 (*)    All other components within normal limits  COMPREHENSIVE METABOLIC PANEL - Abnormal; Notable for the following components:   ALT 13 (*)    Total Bilirubin 1.4 (*)    All other components within normal limits  URINALYSIS, COMPLETE (UACMP) WITH MICROSCOPIC - Abnormal; Notable for the following components:   Color, Urine YELLOW (*)    APPearance CLEAR (*)    All other components within normal  limits  CULTURE, BLOOD (ROUTINE X 2)  CULTURE, BLOOD (ROUTINE X 2)  URINE CULTURE  TROPONIN I  LACTIC ACID, PLASMA  CBG MONITORING, ED   ____________________________________________  EKG  ED ECG REPORT I, SUNG,JADE J, the attending physician, personally viewed and interpreted this ECG.   Date: 02/22/2018  EKG Time: 2254  Rate: 83  Rhythm: atrial fibrillation, rate 83  Axis: LAD  Intervals:left bundle branch block  ST&T Change: Nonspecific No significant change compared to 01/15/2018 ____________________________________________  RADIOLOGY  ED MD interpretation: No acute intracranial abnormality  Portable chest x-ray with question infiltrate versus atelectasis  Chest 2 view without consolidation  Official radiology report(s): Dg Chest 2 View  Result Date: 02/23/2018 CLINICAL DATA:  83 y/o  M; abnormal chest radiograph. EXAM: CHEST - 2 VIEW COMPARISON:  02/22/2018 chest radiograph. FINDINGS: Stable cardiomegaly given projection and technique. Calcific atherosclerosis of aorta. Bronchitic changes are present in the lung bases with improved aeration when compared with prior chest  radiograph. No focal consolidation. No pleural effusion or pneumothorax. Bones are demineralized. Surgical clips project over gastroesophageal junction. IMPRESSION: Bronchitic changes in the lung bases. Improved aeration in comparison with prior chest radiograph. No focal consolidation. Cardiomegaly and aortic atherosclerosis. Electronically Signed   By: Mitzi Hansen M.D.   On: 02/23/2018 03:02   Ct Head Wo Contrast  Result Date: 02/22/2018 CLINICAL DATA:  Confusion. EXAM: CT HEAD WITHOUT CONTRAST TECHNIQUE: Contiguous axial images were obtained from the base of the skull through the vertex without intravenous contrast. COMPARISON:  None. FINDINGS: Brain: No evidence for acute infarction, hemorrhage, mass lesion, hydrocephalus, or extra-axial fluid. Generalized atrophy. Hypoattenuation of white matter, likely small vessel disease. BILATERAL deep nuclei lacunar infarcts, appear chronic. Vascular: Calcification of the cavernous internal carotid arteries consistent with cerebrovascular atherosclerotic disease. No signs of intracranial large vessel occlusion. Skull: Normal. Negative for fracture or focal lesion. Sinuses/Orbits: No acute finding. Other: None. IMPRESSION: Atrophy with suspected chronic microvascular ischemic change, and remote lacunar infarcts. No acute intracranial findings. Electronically Signed   By: Elsie Stain M.D.   On: 02/22/2018 23:55   Dg Chest Port 1 View  Result Date: 02/22/2018 CLINICAL DATA:  Altered mental status at daycare facility. Possible sepsis. EXAM: PORTABLE CHEST 1 VIEW COMPARISON:  None. FINDINGS: Marked cardiac enlargement. Ill-defined RIGHT base opacity could represent infiltrate, or subsegmental atelectasis. No overt edema. No effusion or pneumothorax. Osteopenia. RIGHT shoulder DJD. IMPRESSION: Cardiomegaly. Ill-defined RIGHT base opacity, possible RIGHT base infiltrate or subsegmental atelectasis. Consider two-view chest when the patient is stable.  Electronically Signed   By: Elsie Stain M.D.   On: 02/22/2018 23:53    ____________________________________________   PROCEDURES  Procedure(s) performed: None  Procedures  Critical Care performed: No  ____________________________________________   INITIAL IMPRESSION / ASSESSMENT AND PLAN / ED COURSE  As part of my medical decision making, I reviewed the following data within the electronic MEDICAL RECORD NUMBER Nursing notes reviewed and incorporated, Labs reviewed, EKG interpreted, Old chart reviewed, Radiograph reviewed and Notes from prior ED visits   83 year old male with dementia, hypertension, paroxysmal atrial fibrillation not on anticoagulants sent from the facility for altered mentation; history of UTIs.  Initial diagnosis includes but is not limited to UTI, CAD, CVA, metabolic, etc.  Will obtain screening lab work including troponin, CT head to evaluate for intracranial hemorrhage, chest x-ray to evaluate for pneumonia, and reassess.  Clinical Course as of Feb 23 309  Tue Feb 23, 2018  0129 Awaiting urine specimen.  Updated patient  on laboratory and imaging results.  Will send for two-view chest given question of right base atelectasis versus pneumonia on portable chest x-ray.   [JS]  0308 Patient sleeping in no acute distress.  Repeat two-view chest x-ray does not demonstrate focal consolidation.  Urinalysis unremarkable.  Will discharge back to facility.  Strict return precautions given.  Patient verbalizes understanding and agrees with plan of care.   [JS]    Clinical Course User Index [JS] Irean Hong, MD     ____________________________________________   FINAL CLINICAL IMPRESSION(S) / ED DIAGNOSES  Final diagnoses:  Dementia without behavioral disturbance, unspecified dementia type     ED Discharge Orders    None       Note:  This document was prepared using Dragon voice recognition software and may include unintentional dictation errors.     Irean Hong, MD 02/23/18 [phone]

## 2018-02-23 ENCOUNTER — Emergency Department: Payer: Medicare Other

## 2018-02-23 LAB — CBC WITH DIFFERENTIAL/PLATELET
Basophils Absolute: 0.1 10*3/uL (ref 0–0.1)
Basophils Relative: 1 %
Eosinophils Absolute: 0.2 10*3/uL (ref 0–0.7)
Eosinophils Relative: 3 %
HEMATOCRIT: 34.1 % — AB (ref 40.0–52.0)
Hemoglobin: 10.9 g/dL — ABNORMAL LOW (ref 13.0–18.0)
LYMPHS PCT: 23 %
Lymphs Abs: 1.8 10*3/uL (ref 1.0–3.6)
MCH: 25.9 pg — ABNORMAL LOW (ref 26.0–34.0)
MCHC: 31.9 g/dL — AB (ref 32.0–36.0)
MCV: 81 fL (ref 80.0–100.0)
MONO ABS: 0.5 10*3/uL (ref 0.2–1.0)
MONOS PCT: 7 %
NEUTROS ABS: 5.1 10*3/uL (ref 1.4–6.5)
Neutrophils Relative %: 66 %
PLATELETS: 170 10*3/uL (ref 150–440)
RBC: 4.22 MIL/uL — ABNORMAL LOW (ref 4.40–5.90)
RDW: 17 % — AB (ref 11.5–14.5)
WBC: 7.7 10*3/uL (ref 3.8–10.6)

## 2018-02-23 LAB — URINALYSIS, COMPLETE (UACMP) WITH MICROSCOPIC
BACTERIA UA: NONE SEEN
BILIRUBIN URINE: NEGATIVE
GLUCOSE, UA: NEGATIVE mg/dL
Hgb urine dipstick: NEGATIVE
Ketones, ur: NEGATIVE mg/dL
Leukocytes, UA: NEGATIVE
NITRITE: NEGATIVE
PH: 6 (ref 5.0–8.0)
Protein, ur: NEGATIVE mg/dL
SPECIFIC GRAVITY, URINE: 1.01 (ref 1.005–1.030)
Squamous Epithelial / LPF: NONE SEEN (ref 0–5)

## 2018-02-23 LAB — COMPREHENSIVE METABOLIC PANEL
ALT: 13 U/L — ABNORMAL LOW (ref 17–63)
ANION GAP: 9 (ref 5–15)
AST: 24 U/L (ref 15–41)
Albumin: 4.1 g/dL (ref 3.5–5.0)
Alkaline Phosphatase: 125 U/L (ref 38–126)
BILIRUBIN TOTAL: 1.4 mg/dL — AB (ref 0.3–1.2)
BUN: 15 mg/dL (ref 6–20)
CHLORIDE: 104 mmol/L (ref 101–111)
CO2: 24 mmol/L (ref 22–32)
Calcium: 9.1 mg/dL (ref 8.9–10.3)
Creatinine, Ser: 0.71 mg/dL (ref 0.61–1.24)
GFR calc non Af Amer: >60 mL/min (ref >60)
Glucose, Bld: 97 mg/dL (ref 65–99)
POTASSIUM: 3.5 mmol/L (ref 3.5–5.1)
Sodium: 137 mmol/L (ref 135–145)
TOTAL PROTEIN: 7.3 g/dL (ref 6.5–8.1)

## 2018-02-23 LAB — LACTIC ACID, PLASMA: LACTIC ACID, VENOUS: 1.2 mmol/L (ref 0.5–1.9)

## 2018-02-23 LAB — TROPONIN I

## 2018-02-23 NOTE — Discharge Instructions (Addendum)
Continue all medications as directed by your doctor.  Return to the ER for worsening symptoms, persistent vomiting, difficulty breathing or other concerns. °

## 2018-02-23 NOTE — ED Notes (Signed)
Pt has no complaints and has repeated asked to go home. Denies n/v/d, none demonstrated. Denies CP and ShoB, none demonstrated. Pt has poor mobility and that is unchanged. Pt is reported to have AMS, pt not observably overly sleepy and is able to answer questions of orientation if given time. Pt has hx of dementia.

## 2018-02-24 LAB — URINE CULTURE: Culture: NO GROWTH

## 2018-02-28 LAB — CULTURE, BLOOD (ROUTINE X 2)
CULTURE: NO GROWTH
Culture: NO GROWTH
SPECIAL REQUESTS: ADEQUATE
SPECIAL REQUESTS: ADEQUATE

## 2018-04-10 ENCOUNTER — Emergency Department: Payer: Medicare Other

## 2018-04-10 ENCOUNTER — Inpatient Hospital Stay
Admission: EM | Admit: 2018-04-10 | Discharge: 2018-04-14 | DRG: 069 | Disposition: A | Discharge status: Skilled Nursing Facility | Payer: Medicare Other | Source: Skilled Nursing Facility | Attending: Internal Medicine | Admitting: Internal Medicine

## 2018-04-10 ENCOUNTER — Other Ambulatory Visit: Payer: Self-pay

## 2018-04-10 ENCOUNTER — Observation Stay: Payer: Medicare Other

## 2018-04-10 ENCOUNTER — Encounter: Payer: Self-pay | Admitting: Emergency Medicine

## 2018-04-10 DIAGNOSIS — Z515 Encounter for palliative care: Secondary | ICD-10-CM

## 2018-04-10 DIAGNOSIS — Z7189 Other specified counseling: Secondary | ICD-10-CM

## 2018-04-10 DIAGNOSIS — R4781 Slurred speech: Secondary | ICD-10-CM | POA: Diagnosis present

## 2018-04-10 DIAGNOSIS — R296 Repeated falls: Secondary | ICD-10-CM | POA: Diagnosis present

## 2018-04-10 DIAGNOSIS — F03918 Unspecified dementia, unspecified severity, with other behavioral disturbance: Secondary | ICD-10-CM

## 2018-04-10 DIAGNOSIS — R4182 Altered mental status, unspecified: Secondary | ICD-10-CM

## 2018-04-10 DIAGNOSIS — Z66 Do not resuscitate: Secondary | ICD-10-CM | POA: Diagnosis present

## 2018-04-10 DIAGNOSIS — G9341 Metabolic encephalopathy: Secondary | ICD-10-CM | POA: Diagnosis present

## 2018-04-10 DIAGNOSIS — G459 Transient cerebral ischemic attack, unspecified: Principal | ICD-10-CM | POA: Diagnosis present

## 2018-04-10 DIAGNOSIS — R197 Diarrhea, unspecified: Secondary | ICD-10-CM | POA: Diagnosis present

## 2018-04-10 DIAGNOSIS — I10 Essential (primary) hypertension: Secondary | ICD-10-CM | POA: Diagnosis present

## 2018-04-10 DIAGNOSIS — F0391 Unspecified dementia with behavioral disturbance: Secondary | ICD-10-CM | POA: Diagnosis present

## 2018-04-10 DIAGNOSIS — E876 Hypokalemia: Secondary | ICD-10-CM | POA: Diagnosis present

## 2018-04-10 DIAGNOSIS — R32 Unspecified urinary incontinence: Secondary | ICD-10-CM | POA: Diagnosis present

## 2018-04-10 DIAGNOSIS — I480 Paroxysmal atrial fibrillation: Secondary | ICD-10-CM | POA: Diagnosis present

## 2018-04-10 DIAGNOSIS — Z791 Long term (current) use of non-steroidal anti-inflammatories (NSAID): Secondary | ICD-10-CM

## 2018-04-10 DIAGNOSIS — E739 Lactose intolerance, unspecified: Secondary | ICD-10-CM | POA: Diagnosis present

## 2018-04-10 DIAGNOSIS — Z79899 Other long term (current) drug therapy: Secondary | ICD-10-CM

## 2018-04-10 DIAGNOSIS — Z87440 Personal history of urinary (tract) infections: Secondary | ICD-10-CM

## 2018-04-10 DIAGNOSIS — Z7982 Long term (current) use of aspirin: Secondary | ICD-10-CM

## 2018-04-10 DIAGNOSIS — Z8249 Family history of ischemic heart disease and other diseases of the circulatory system: Secondary | ICD-10-CM

## 2018-04-10 DIAGNOSIS — Z880 Allergy status to penicillin: Secondary | ICD-10-CM

## 2018-04-10 DIAGNOSIS — Z885 Allergy status to narcotic agent status: Secondary | ICD-10-CM

## 2018-04-10 DIAGNOSIS — R29703 NIHSS score 3: Secondary | ICD-10-CM | POA: Diagnosis present

## 2018-04-10 DIAGNOSIS — G4700 Insomnia, unspecified: Secondary | ICD-10-CM | POA: Diagnosis present

## 2018-04-10 DIAGNOSIS — Z981 Arthrodesis status: Secondary | ICD-10-CM

## 2018-04-10 LAB — URINALYSIS, COMPLETE (UACMP) WITH MICROSCOPIC
BACTERIA UA: NONE SEEN
BILIRUBIN URINE: NEGATIVE
Glucose, UA: NEGATIVE mg/dL
HGB URINE DIPSTICK: NEGATIVE
Ketones, ur: NEGATIVE mg/dL
LEUKOCYTES UA: NEGATIVE
NITRITE: NEGATIVE
PROTEIN: 30 mg/dL — AB
Specific Gravity, Urine: 1.02 (ref 1.005–1.030)
pH: 6 (ref 5.0–8.0)

## 2018-04-10 LAB — COMPREHENSIVE METABOLIC PANEL
ALBUMIN: 3.7 g/dL (ref 3.5–5.0)
ALK PHOS: 109 U/L (ref 38–126)
ALT: 10 U/L — ABNORMAL LOW (ref 17–63)
ANION GAP: 9 (ref 5–15)
AST: 22 U/L (ref 15–41)
BILIRUBIN TOTAL: 1.1 mg/dL (ref 0.3–1.2)
BUN: 13 mg/dL (ref 6–20)
CALCIUM: 8.4 mg/dL — AB (ref 8.9–10.3)
CO2: 24 mmol/L (ref 22–32)
Chloride: 105 mmol/L (ref 101–111)
Creatinine, Ser: 0.8 mg/dL (ref 0.61–1.24)
GFR calc Af Amer: >60 mL/min (ref >60)
GFR calc non Af Amer: >60 mL/min (ref >60)
GLUCOSE: 111 mg/dL — AB (ref 65–99)
POTASSIUM: 3 mmol/L — AB (ref 3.5–5.1)
SODIUM: 138 mmol/L (ref 135–145)
Total Protein: 6.6 g/dL (ref 6.5–8.1)

## 2018-04-10 LAB — CBC WITH DIFFERENTIAL/PLATELET
BASOS ABS: 0.1 10*3/uL (ref 0–0.1)
BASOS PCT: 1 %
EOS ABS: 0.3 10*3/uL (ref 0–0.7)
Eosinophils Relative: 6 %
HEMATOCRIT: 32.6 % — AB (ref 40.0–52.0)
HEMOGLOBIN: 10.6 g/dL — AB (ref 13.0–18.0)
Lymphocytes Relative: 19 %
Lymphs Abs: 1.2 10*3/uL (ref 1.0–3.6)
MCH: 25.7 pg — ABNORMAL LOW (ref 26.0–34.0)
MCHC: 32.4 g/dL (ref 32.0–36.0)
MCV: 79.2 fL — ABNORMAL LOW (ref 80.0–100.0)
MONOS PCT: 7 %
Monocytes Absolute: 0.4 10*3/uL (ref 0.2–1.0)
NEUTROS ABS: 4.2 10*3/uL (ref 1.4–6.5)
NEUTROS PCT: 67 %
Platelets: 154 10*3/uL (ref 150–440)
RBC: 4.12 MIL/uL — ABNORMAL LOW (ref 4.40–5.90)
RDW: 17.4 % — ABNORMAL HIGH (ref 11.5–14.5)
WBC: 6.2 10*3/uL (ref 3.8–10.6)

## 2018-04-10 LAB — MRSA PCR SCREENING: MRSA by PCR: NEGATIVE

## 2018-04-10 LAB — TROPONIN I: Troponin I: <0.03 ng/mL (ref <0.03)

## 2018-04-10 MED ORDER — OXYCODONE HCL 5 MG PO TABS
2.5000 mg | ORAL_TABLET | Freq: Four times a day (QID) | ORAL | Status: DC | PRN
  Administered 2018-04-10 – 2018-04-13 (×2): 2.5 mg via ORAL
  Filled 2018-04-10 (×2): qty 1

## 2018-04-10 MED ORDER — TRAZODONE HCL 50 MG PO TABS
50.0000 mg | ORAL_TABLET | Freq: Every day | ORAL | Status: DC
  Administered 2018-04-10 – 2018-04-13 (×4): 50 mg via ORAL
  Filled 2018-04-10 (×4): qty 1

## 2018-04-10 MED ORDER — TAMSULOSIN HCL 0.4 MG PO CAPS
0.4000 mg | ORAL_CAPSULE | Freq: Every day | ORAL | Status: DC
  Administered 2018-04-11 – 2018-04-14 (×4): 0.4 mg via ORAL
  Filled 2018-04-10 (×4): qty 1

## 2018-04-10 MED ORDER — GUAIFENESIN-DM 100-10 MG/5ML PO SYRP
ORAL_SOLUTION | ORAL | Status: AC
  Administered 2018-04-10: 5 mL via ORAL
  Filled 2018-04-10: qty 5

## 2018-04-10 MED ORDER — HYDRALAZINE HCL 20 MG/ML IJ SOLN: 10.0000 mg | Freq: Four times a day (QID) | INTRAMUSCULAR | Status: DC | PRN

## 2018-04-10 MED ORDER — ASPIRIN 300 MG RE SUPP: 300.0000 mg | Freq: Every day | RECTAL | Status: DC

## 2018-04-10 MED ORDER — MELATONIN 5 MG PO TABS
3.0000 mg | ORAL_TABLET | Freq: Every day | ORAL | Status: DC
  Administered 2018-04-10 – 2018-04-13 (×4): 2.5 mg via ORAL
  Filled 2018-04-10 (×4): qty 0.5
  Filled 2018-04-10: qty 1
  Filled 2018-04-10: qty 0.5

## 2018-04-10 MED ORDER — STROKE: EARLY STAGES OF RECOVERY BOOK: Freq: Once | Status: DC

## 2018-04-10 MED ORDER — ENOXAPARIN SODIUM 40 MG/0.4ML ~~LOC~~ SOLN
40.0000 mg | SUBCUTANEOUS | Status: DC
  Administered 2018-04-11 – 2018-04-13 (×3): 40 mg via SUBCUTANEOUS
  Filled 2018-04-10 (×3): qty 0.4

## 2018-04-10 MED ORDER — CARVEDILOL 25 MG PO TABS
25.0000 mg | ORAL_TABLET | Freq: Two times a day (BID) | ORAL | Status: DC
  Administered 2018-04-11 – 2018-04-14 (×4): 25 mg via ORAL
  Filled 2018-04-10 (×5): qty 1

## 2018-04-10 MED ORDER — ACETAMINOPHEN 325 MG PO TABS: 650.0000 mg | ORAL_TABLET | ORAL | Status: DC | PRN

## 2018-04-10 MED ORDER — GUAIFENESIN-DM 100-10 MG/5ML PO SYRP
5.0000 mL | ORAL_SOLUTION | ORAL | Status: DC | PRN
  Administered 2018-04-10 – 2018-04-13 (×3): 5 mL via ORAL
  Filled 2018-04-10 (×2): qty 5

## 2018-04-10 MED ORDER — ASPIRIN 325 MG PO TABS
325.0000 mg | ORAL_TABLET | Freq: Every day | ORAL | Status: DC
  Administered 2018-04-11 – 2018-04-14 (×4): 325 mg via ORAL
  Filled 2018-04-10 (×4): qty 1

## 2018-04-10 MED ORDER — ACETAMINOPHEN 160 MG/5ML PO SOLN
650.0000 mg | ORAL | Status: DC | PRN
Start: 2018-04-10 — End: 2018-04-14
  Filled 2018-04-10: qty 20.3

## 2018-04-10 MED ORDER — IOHEXOL 350 MG/ML SOLN
75.0000 mL | Freq: Once | INTRAVENOUS | Status: AC | PRN
  Administered 2018-04-10: 75 mL via INTRAVENOUS

## 2018-04-10 MED ORDER — ACETAMINOPHEN 650 MG RE SUPP: 650.0000 mg | RECTAL | Status: DC | PRN

## 2018-04-10 NOTE — ED Notes (Signed)
CT called in regards to pt. This RN cleared pt for CT. Awaiting ct transport at this time.

## 2018-04-10 NOTE — ED Provider Notes (Addendum)
[facility] Emergency Department Provider Note    First MD Initiated Contact with Patient 04/10/18 1209     (approximate)  I have reviewed the triage vital signs and the nursing notes.   HISTORY  Chief Complaint Altered Mental Status  Level V Caveat:  dementia  HPI Bryson Gavia is a 83 y.o. male the history of dementia as well as hypertension and paroxysmal A. fib presents to the ER from [facility] for confusion.  Wife visited him and felt that he was more confused than usual this morning.  Patient is uncertain as to why he is here in the ER.  Answering questions and states that he does have urgency and feels that he needs to empty his bowels.  Denies any fevers.  No chest pain.  No numbness or tingling.    Past Medical History:  Diagnosis Date  . Dementia   . HTN (hypertension)   . Insomnia   . Paroxysmal atrial fibrillation (HCC)    Family History  Problem Relation Age of Onset  . Hypertension Other    Past Surgical History:  Procedure Laterality Date  . INTRAMEDULLARY (IM) NAIL INTERTROCHANTERIC Right 01/06/2018   Procedure: INTRAMEDULLARY (IM) NAIL INTERTROCHANTRIC;  Surgeon: Signa Kell, MD;  Location: [facility] ORS;  Service: Orthopedics;  Laterality: Right;  . NO PAST SURGERIES     Patient Active Problem List   Diagnosis Date Noted  . Acute encephalopathy 01/13/2018  . Closed right hip fracture (HCC) 01/06/2018  . AF (paroxysmal atrial fibrillation) (HCC) 01/06/2018  . HTN (hypertension) 01/06/2018  . Insomnia 01/06/2018      Prior to Admission medications   Medication Sig Start Date End Date Taking? Authorizing Provider  acetaminophen (TYLENOL) 325 MG tablet Take 975 mg by mouth every 8 (eight) hours.   Yes [provider]  aspirin 81 MG chewable tablet Chew 81 mg by mouth daily.   Yes [provider]  carvedilol (COREG) 6.25 MG tablet Take 6.25 mg by mouth 2 (two) times daily with a meal.   Yes [provider]  cholecalciferol (VITAMIN D) 1000 units tablet Take 2,000 Units by mouth daily.    Yes [provider]  ibuprofen (ADVIL,MOTRIN) 200 MG tablet Take 200 mg by mouth 2 (two) times daily as needed for mild pain.   Yes [provider]  Lactase (LACTAID PO) Take 1 tablet by mouth 3 (three) times daily with meals.   Yes [provider]  lidocaine (LIDODERM) 5 % Place 1 patch onto the skin daily. Remove & Discard patch within 12 hours or as directed by MD   Yes [provider]  Melatonin 3 MG TABS Take 3 mg by mouth at bedtime.   Yes [provider]  oxyCODONE (OXY IR/ROXICODONE) 5 MG immediate release tablet Take 2.5 mg by mouth daily as needed for severe pain.   Yes [provider]  senna (SENOKOT) 8.6 MG TABS tablet Take 1 tablet by mouth 2 (two) times daily as needed for mild constipation.   Yes [provider]  tamsulosin (FLOMAX) 0.4 MG CAPS capsule Take 1 capsule (0.4 mg total) by mouth daily. Patient taking differently: Take 0.8 mg by mouth daily.  01/17/18  Yes Mody, Patricia Pesa, MD  traZODone (DESYREL) 50 MG tablet Take 50 mg by mouth at bedtime.   Yes [provider]  vitamin B-12 (CYANOCOBALAMIN) 1000 MCG tablet Take 1,000 mcg by mouth daily.   Yes [provider]  carvedilol (COREG) 25  MG tablet Take 1 tablet (25 mg total) by mouth 2 (two) times daily with a meal. Patient not taking: Reported on 02/23/2018 01/08/18   Enedina FinnerPatel, Sona, MD  docusate sodium (COLACE) 100 MG capsule Take 1 capsule (100 mg total) by mouth 2 (two) times daily. Patient not taking: Reported on 02/23/2018 01/08/18   Enedina FinnerPatel, Sona, MD  traMADol (ULTRAM) 50 MG tablet Take 1 tablet (50 mg total) by mouth 2 (two) times daily. Patient not taking: Reported on 02/23/2018 01/16/18   Enid BaasKalisetti, Radhika, MD    Allergies Penicillins; Milk-related compounds; and Tramadol    Social History Social History   Tobacco Use  . Smoking status: Never Smoker   . Smokeless tobacco: Never Used  Substance Use Topics  . Alcohol use: No    Frequency: Never  . Drug use: No    Review of Systems Patient denies headaches, rhinorrhea, blurry vision, numbness, shortness of breath, chest pain, edema, cough, abdominal pain, nausea, vomiting, diarrhea, dysuria, fevers, rashes or hallucinations unless otherwise stated above in HPI. ____________________________________________   PHYSICAL EXAM:  VITAL SIGNS: Vitals:   04/10/18 1245 04/10/18 1300  BP:  (!) 158/115  Pulse: (!) 53 (!) 58  Resp: 20 (!) 23  Temp:    SpO2: 94% 96%    Constitutional: Alert, elderly and frail appearing Eyes: Conjunctivae are normal.  Head: Atraumatic. Nose: No congestion/rhinnorhea. Mouth/Throat: Mucous membranes are moist.   Neck: No stridor. Painless ROM.  Cardiovascular: Normal rate, regular rhythm. Grossly normal heart sounds.  Good peripheral circulation. Respiratory: Normal respiratory effort.  No retractions. Lungs CTAB. Gastrointestinal: Soft and nontender. No distention. No abdominal bruits. No CVA tenderness. Genitourinary:  Musculoskeletal: No lower extremity tenderness nor edema.  No joint effusions. Neurologic:  Normal speech and language. No gross focal neurologic deficits are appreciated. No facial droop Skin:  Skin is warm, dry and intact. No rash noted. Psychiatric: behavior is normal.____________________________________________   LABS (all labs ordered are listed, but only abnormal results are displayed)  Results for orders placed or performed during the hospital encounter of 04/10/18 (from the past 24 hour(s))  CBC with Differential/Platelet     Status: Abnormal   Collection Time: 04/10/18 11:38 AM  Result Value Ref Range   WBC 6.2 3.8 - 10.6 K/uL   RBC 4.12 (L) 4.40 - 5.90 MIL/uL   Hemoglobin 10.6 (L) 13.0 - 18.0 g/dL   HCT 16.132.6 (L) 09.640.0 - 04.552.0 %   MCV 79.2 (L) 80.0 - 100.0 fL   MCH 25.7 (L) 26.0 - 34.0 pg   MCHC 32.4 32.0 - 36.0 g/dL    RDW 40.917.4 (H) 81.111.5 - 14.5 %   Platelets 154 150 - 440 K/uL   Neutrophils Relative % 67 %   Neutro Abs 4.2 1.4 - 6.5 K/uL   Lymphocytes Relative 19 %   Lymphs Abs 1.2 1.0 - 3.6 K/uL   Monocytes Relative 7 %   Monocytes Absolute 0.4 0.2 - 1.0 K/uL   Eosinophils Relative 6 %   Eosinophils Absolute 0.3 0 - 0.7 K/uL   Basophils Relative 1 %   Basophils Absolute 0.1 0 - 0.1 K/uL  Comprehensive metabolic panel     Status: Abnormal   Collection Time: 04/10/18 11:38 AM  Result Value Ref Range   Sodium 138 135 - 145 mmol/L   Potassium 3.0 (L) 3.5 - 5.1 mmol/L   Chloride 105 101 - 111 mmol/L   CO2 24 22 - 32 mmol/L   Glucose, Bld 111 (H) 65 - 99  mg/dL   BUN 13 6 - 20 mg/dL   Creatinine, Ser 1.61 0.61 - 1.24 mg/dL   Calcium 8.4 (L) 8.9 - 10.3 mg/dL   Total Protein 6.6 6.5 - 8.1 g/dL   Albumin 3.7 3.5 - 5.0 g/dL   AST 22 15 - 41 U/L   ALT 10 (L) 17 - 63 U/L   Alkaline Phosphatase 109 38 - 126 U/L   Total Bilirubin 1.1 0.3 - 1.2 mg/dL   GFR calc non Af Amer >60 >60 mL/min   GFR calc Af Amer >60 >60 mL/min   Anion gap 9 5 - 15  Troponin I     Status: None   Collection Time: 04/10/18 11:38 AM  Result Value Ref Range   Troponin I <0.03 <0.03 ng/mL  Urinalysis, Complete w Microscopic     Status: Abnormal   Collection Time: 04/10/18 11:38 AM  Result Value Ref Range   Color, Urine YELLOW (A) YELLOW   APPearance CLEAR (A) CLEAR   Specific Gravity, Urine 1.020 1.005 - 1.030   pH 6.0 5.0 - 8.0   Glucose, UA NEGATIVE NEGATIVE mg/dL   Hgb urine dipstick NEGATIVE NEGATIVE   Bilirubin Urine NEGATIVE NEGATIVE   Ketones, ur NEGATIVE NEGATIVE mg/dL   Protein, ur 30 (A) NEGATIVE mg/dL   Nitrite NEGATIVE NEGATIVE   Leukocytes, UA NEGATIVE NEGATIVE   RBC / HPF 0-5 0 - 5 RBC/hpf   WBC, UA 0-5 0 - 5 WBC/hpf   Bacteria, UA NONE SEEN NONE SEEN   Squamous Epithelial / LPF 0-5 0 - 5   Mucus PRESENT    ____________________________________________  EKG My review and personal interpretation at Time:  11:36   Indication: ams  Rate: 75  Rhythm: afib Axis: normal Other: normal intervals, no stemi, lbbb ____________________________________________  RADIOLOGY  I personally reviewed all radiographic images ordered to evaluate for the above acute complaints and reviewed radiology reports and findings.  These findings were personally discussed with the patient.  Please see medical record for radiology report.  ____________________________________________   PROCEDURES  Procedure(s) performed:  Procedures    Critical Care performed: no ____________________________________________   INITIAL IMPRESSION / ASSESSMENT AND PLAN / ED COURSE  Pertinent labs & imaging results that were available during my care of the patient were reviewed by me and considered in my medical decision making (see chart for details).   DDX: Dehydration, sepsis, pna, uti, hypoglycemia, cva, drug effect, withdrawal, dementia   Tyvon Eggenberger is a 83 y.o. who presents to the ED with reported confusion and altered mental status.  Patient denies any symptoms at this time.  Will order blood work and radiographs to the above differential.  Patient outside of the window for stroke and has no focal deficits at this time.  Clinical Course as of Apr 11 1503  Sat Apr 10, 2018  1419 Blood work is reassuring.  Patient in no acute distress at this time.   [PR]  1502 Daughter called stating the patient did have slurred speech early this morning last seen normal was yesterday.  Reportedly has had decreased oral intake over the past 2 days.  Informed patient that blood work and radiographs thus far been reassuring.  Will order MRI to further evaluate for stroke.  She has asked if the patient will be to be admitted [facility] will be signed out to oncoming physician pending MRI.   [PR]    Clinical Course User Index [PR] Willy Eddy, MD  Was unable to sit through MRI based on his presentation  risk factors not currently being on anticoagulation with A. fib with concerning features for TIA have ordered CT angiogram and I discussed case with hospitalist who kindly agreed to place patient in the hospital for further medical management.   As part of my medical decision making, I reviewed the following data within the electronic MEDICAL RECORD NUMBER Nursing notes reviewed and incorporated, Labs reviewed, notes from prior ED visits.  ____________________________________________   FINAL CLINICAL IMPRESSION(S) / ED DIAGNOSES  Final diagnoses:  Altered mental status, unspecified altered mental status type  TIA (transient ischemic attack)      NEW MEDICATIONS STARTED DURING THIS VISIT:  New Prescriptions   No medications on file     Note:  This document was prepared using Dragon voice recognition software and may include unintentional dictation errors.    Willy Eddy, MD 04/10/18 1505    Willy Eddy, MD 04/10/18 1606

## 2018-04-10 NOTE — Progress Notes (Signed)
Patient placed on contact precautions due to recent history of ESBL and MRSA PCR collected as [facility]. Per daytime RN, patient passed the stroke swallow screen so NPO d/c'd and order for heart healthy diet placed. Nursing staff will continue to monitor for any changes in patient status. Lamonte RicherKara A Evella Kasal, RN

## 2018-04-10 NOTE — ED Triage Notes (Signed)
Pt [facility] via [facility] for altered mental status. Wife visited pt this morning and noticed he was not acting normal. Pt currently not c/o pain.

## 2018-04-10 NOTE — H&P (Signed)
[employer] - [place] at [facility]   PATIENT NAME: Trevor Porter    MR#:  [id number]  DATE OF BIRTH:  Jun 16, 1935  DATE OF ADMISSION:  04/10/2018  PRIMARY CARE PHYSICIAN: Keane Police, MD   REQUESTING/REFERRING PHYSICIAN: Dr. Roxan Hockey  CHIEF COMPLAINT:   Chief Complaint  Patient presents with  . Altered Mental Status    HISTORY OF PRESENT ILLNESS:  Trevor Porter  is a 83 y.o. male with a known history of atrial fibrillation, dementia, prior UTIs presents to the hospital due to concern for slurred speech earlier today. At this time patient is pleasantly confused. No focal weakness. CT scan of the head was negative. MRI of the brain was ordered in the emergency room but could not be done due to neck fusion surgery and patient unable to extend his neck. CT head and neck were checked and showed nothing acute. Patient is being admitted for workup of TIA. Patient has history of atrial fibrillation but his anticoagulation was stopped in the past due to recurrent falls.  PAST MEDICAL HISTORY:   Past Medical History:  Diagnosis Date  . Dementia   . HTN (hypertension)   . Insomnia   . Paroxysmal atrial fibrillation (HCC)     PAST SURGICAL HISTORY:   Past Surgical History:  Procedure Laterality Date  . INTRAMEDULLARY (IM) NAIL INTERTROCHANTERIC Right 01/06/2018   Procedure: INTRAMEDULLARY (IM) NAIL INTERTROCHANTRIC;  Surgeon: Signa Kell, MD;  Location: [facility] ORS;  Service: Orthopedics;  Laterality: Right;  . NO PAST SURGERIES      SOCIAL HISTORY:   Social History   Tobacco Use  . Smoking status: Never Smoker  . Smokeless tobacco: Never Used  Substance Use Topics  . Alcohol use: No    Frequency: Never    FAMILY HISTORY:   Family History  Problem Relation Age of Onset  . Hypertension Other     DRUG ALLERGIES:   Allergies  Allergen Reactions  . Penicillins Other (See Comments)    Patient passed out Has patient had a PCN reaction causing  immediate rash, facial/tongue/throat swelling, SOB or lightheadedness with hypotension:YES Has patient had a PCN reaction causing severe rash involving mucus membranes or skin necrosis: Unknown Has patient had a PCN reaction that required hospitalization: Unknown Has patient had a PCN reaction occurring within the last 10 years: No If all of the above answers are "NO", then may proceed with Cephalosporin use.  . Milk-Related Compounds Other (See Comments)    Unknown  . Tramadol Other (See Comments)    Unknown    REVIEW OF SYSTEMS:   Review of Systems  Unable to perform ROS: Dementia    MEDICATIONS AT HOME:   Prior to Admission medications   Medication Sig Start Date End Date Taking? Authorizing Provider  acetaminophen (TYLENOL) 325 MG tablet Take 975 mg by mouth every 8 (eight) hours.   Yes [provider]  aspirin 81 MG chewable tablet Chew 81 mg by mouth daily.   Yes [provider]  carvedilol (COREG) 6.25 MG tablet Take 6.25 mg by mouth 2 (two) times daily with a meal.   Yes [provider]  cholecalciferol (VITAMIN D) 1000 units tablet Take 2,000 Units by mouth daily.    Yes [provider]  ibuprofen (ADVIL,MOTRIN) 200 MG tablet Take 200 mg by mouth 2 (two) times daily as needed for mild pain.   Yes [provider]  Lactase (LACTAID PO) Take 1 tablet by mouth 3 (three) times daily with meals.  Yes [provider]  lidocaine (LIDODERM) 5 % Place 1 patch onto the skin daily. Remove & Discard patch within 12 hours or as directed by MD   Yes [provider]  Melatonin 3 MG TABS Take 3 mg by mouth at bedtime.   Yes [provider]  oxyCODONE (OXY IR/ROXICODONE) 5 MG immediate release tablet Take 2.5 mg by mouth daily as needed for severe pain.   Yes [provider]  senna (SENOKOT) 8.6 MG TABS tablet Take 1 tablet by mouth 2 (two) times daily as needed for mild constipation.   Yes [provider]   tamsulosin (FLOMAX) 0.4 MG CAPS capsule Take 1 capsule (0.4 mg total) by mouth daily. Patient taking differently: Take 0.8 mg by mouth daily.  01/17/18  Yes Mody, Patricia PesaSital, MD  traZODone (DESYREL) 50 MG tablet Take 50 mg by mouth at bedtime.   Yes [provider]  vitamin B-12 (CYANOCOBALAMIN) 1000 MCG tablet Take 1,000 mcg by mouth daily.   Yes [provider]  carvedilol (COREG) 25 MG tablet Take 1 tablet (25 mg total) by mouth 2 (two) times daily with a meal. Patient not taking: Reported on 02/23/2018 01/08/18   Enedina FinnerPatel, Sona, MD  docusate sodium (COLACE) 100 MG capsule Take 1 capsule (100 mg total) by mouth 2 (two) times daily. Patient not taking: Reported on 02/23/2018 01/08/18   Enedina FinnerPatel, Sona, MD  traMADol (ULTRAM) 50 MG tablet Take 1 tablet (50 mg total) by mouth 2 (two) times daily. Patient not taking: Reported on 02/23/2018 01/16/18   Enid BaasKalisetti, Radhika, MD     VITAL SIGNS:  Blood pressure (!) 185/85, pulse 60, temperature 98 F (36.7 C), temperature source Oral, resp. rate 14, height 5\' 11"  (1.803 m), weight 63.5 kg (140 lb), SpO2 99 %.  PHYSICAL EXAMINATION:  Physical Exam  GENERAL:  83 y.o.-year-old patient lying in the bed with no acute distress.  EYES: Pupils equal, round, reactive to light and accommodation. No scleral icterus. Extraocular muscles intact.  HEENT: Head atraumatic, normocephalic. Oropharynx and nasopharynx clear. No oropharyngeal erythema, moist oral mucosa  NECK:  Supple, no jugular venous distention. No thyroid enlargement, no tenderness.  LUNGS: Normal breath sounds bilaterally, no wheezing, rales, rhonchi. No use of accessory muscles of respiration.  CARDIOVASCULAR: S1, S2 normal. No murmurs, rubs, or gallops.  ABDOMEN: Soft, nontender, nondistended. Bowel sounds present. No organomegaly or mass.  EXTREMITIES: No pedal edema, cyanosis, or clubbing. + 2 pedal & radial pulses b/l.   NEUROLOGIC: Cranial nerves II through XII are intact. No focal Motor or  sensory deficits appreciated b/l PSYCHIATRIC: The patient is alert and awake. Confused SKIN: No obvious rash, lesion, or ulcer.   LABORATORY PANEL:   CBC Recent Labs  Lab 04/10/18 1138  WBC 6.2  HGB 10.6*  HCT 32.6*  PLT 154   ------------------------------------------------------------------------------------------------------------------  Chemistries  Recent Labs  Lab 04/10/18 1138  NA 138  K 3.0*  CL 105  CO2 24  GLUCOSE 111*  BUN 13  CREATININE 0.80  CALCIUM 8.4*  AST 22  ALT 10*  ALKPHOS 109  BILITOT 1.1   ------------------------------------------------------------------------------------------------------------------  Cardiac Enzymes Recent Labs  Lab 04/10/18 1138  TROPONINI <0.03   ------------------------------------------------------------------------------------------------------------------  RADIOLOGY:  Dg Chest 1 View  Result Date: 04/10/2018 CLINICAL DATA:  Altered mental status. EXAM: CHEST  1 VIEW COMPARISON:  02/23/2018 FINDINGS: The cardiac silhouette is moderately enlarged, similar to the prior study. Mild bronchitic changes are again noted. No airspace consolidation, edema, sizable pleural effusion,  or pneumothorax is identified. An old right third rib fracture and GE junction region surgical clips are noted. IMPRESSION: Cardiomegaly without evidence of edema or pneumonia. Electronically Signed   By: Sebastian Ache M.D.   On: 04/10/2018 14:11   Ct Head Wo Contrast  Result Date: 04/10/2018 CLINICAL DATA:  Altered mental status. EXAM: CT HEAD WITHOUT CONTRAST TECHNIQUE: Contiguous axial images were obtained from the base of the skull through the vertex without intravenous contrast. COMPARISON:  02/22/2018 FINDINGS: Brain: There is no evidence of acute infarct, intracranial hemorrhage, mass, midline shift, or extra-axial fluid collection. Chronic lacunar infarcts are again seen in the left thalamus and posterior right lentiform. Patchy subcortical and  periventricular white matter hypoattenuation bilaterally is unchanged and nonspecific but compatible with moderate chronic small vessel ischemic disease. Lateral and third ventriculomegaly is unchanged without particularly significant temporal horn dilatation, favored to represent moderately advanced central predominant cerebral atrophy over hydrocephalus. Vascular: Calcified atherosclerosis at the skull base. No hyperdense vessel. Skull: .  No fracture or suspicious osseous lesion Sinuses/Orbits: Visualized paranasal sinuses and mastoid air cells are clear. Visualized orbits are unremarkable. Other: None. IMPRESSION: 1. No evidence of acute intracranial abnormality. 2. Moderate chronic small vessel ischemic disease and cerebral atrophy. Electronically Signed   By: Sebastian Ache M.D.   On: 04/10/2018 13:55     IMPRESSION AND PLAN:   *TIA. Stroke quadricep. Neural checks. Aspirin, statin. Unable to do MRI. CT had negative.  Check echocardiogram. Consult neurology.  *Hypertension. Continue home medications. Added IV hydralazine.  *Dementia. Monitor for inpatient delirium.  Paroxysmal atrial fibrillation. Patient on aspirin. Anticoagulation stopped in the past due to recurrent falls.  All the records are reviewed and case discussed with ED provider. Management plans discussed with the patient, family and they are in agreement.  CODE STATUS: do not resuscitate  TOTAL TIME TAKING CARE OF THIS PATIENT: 35 minutes.   Molinda Bailiff Schelly Chuba M.D on 04/10/2018 at 4:43 PM  Between 7am to 6pm - Pager - [phone]  After 6pm go to [email or web address] - password [email or web address] [facility]  [facility]  Office  [phone]  CC: Primary care physician; Keane Police, MD  Note: This dictation was prepared with Dragon dictation along with smaller phrase technology. Any transcriptional errors that result from this process are unintentional.

## 2018-04-10 NOTE — ED Notes (Signed)
Please call pt's daughter with updates: Trevor Porter, [phone].

## 2018-04-10 NOTE — Progress Notes (Signed)
Purpose of Encounter acute hospitalization, code status discussion, dementia  Parties in Attendance patient, HCPOA-daughter Elberta LeatherwoodMary Fields  Patients Decisional capacity patient with dementia and unable to participate in discussion  we discussed regarding patient's presentation and workup for possible TIA. Patient has dementia and is a resident of local nursing home. Discuss code status regarding intubation and CPR. Daughter mentions patient is do not resuscitate and cannot intubate. Orders entered.  Poor long term prognosis with dementia  Time spent - 17 minutes

## 2018-04-11 ENCOUNTER — Observation Stay (HOSPITAL_BASED_OUTPATIENT_CLINIC_OR_DEPARTMENT_OTHER)
Admit: 2018-04-11 | Discharge: 2018-04-11 | Disposition: A | Discharge status: Home / Self Care | Payer: Medicare Other | Attending: Internal Medicine | Admitting: Internal Medicine

## 2018-04-11 DIAGNOSIS — G459 Transient cerebral ischemic attack, unspecified: Secondary | ICD-10-CM | POA: Diagnosis not present

## 2018-04-11 DIAGNOSIS — R4182 Altered mental status, unspecified: Secondary | ICD-10-CM | POA: Diagnosis not present

## 2018-04-11 LAB — LIPID PANEL
CHOLESTEROL: 143 mg/dL (ref 0–200)
HDL: 32 mg/dL — ABNORMAL LOW (ref >40)
LDL CALC: 96 mg/dL (ref 0–99)
Total CHOL/HDL Ratio: 4.5 RATIO
Triglycerides: 77 mg/dL (ref <150)
VLDL: 15 mg/dL (ref 0–40)

## 2018-04-11 LAB — ECHOCARDIOGRAM COMPLETE
HEIGHTINCHES: 71 in
Weight: 2240 oz

## 2018-04-11 LAB — HEMOGLOBIN A1C
Hgb A1c MFr Bld: 5 % (ref 4.8–5.6)
Mean Plasma Glucose: 96.8 mg/dL

## 2018-04-11 MED ORDER — AMLODIPINE BESYLATE 5 MG PO TABS
2.5000 mg | ORAL_TABLET | Freq: Every day | ORAL | Status: DC
  Administered 2018-04-11 – 2018-04-14 (×4): 2.5 mg via ORAL
  Filled 2018-04-11 (×4): qty 1

## 2018-04-11 MED ORDER — SODIUM CHLORIDE 0.9 % IV SOLN
INTRAVENOUS | Status: DC
  Administered 2018-04-11 – 2018-04-12 (×2): via INTRAVENOUS

## 2018-04-11 NOTE — Discharge Instructions (Addendum)
Dysphagia 3 diet. Hospice care.

## 2018-04-11 NOTE — Consult Note (Signed)
Reason for Consult: confusion  Referring Physician:  Dr. Elpidio AnisSudini   CC: Confusion   HPI: Trevor Porter is an 83 y.o. male   known history of atrial fibrillation, dementia, prior UTIs presents to the hospital due to concern for slurred speech which has improved. AT baseline has dementia and oriented to self.   No focal weakness. CT scan of the head was negative. MRI of the brain unable to be done as can't lay flat and has cervical fusion .  Patient has history of atrial fibrillation but his anticoagulation was stopped in the past due to recurrent falls.    Past Medical History:  Diagnosis Date  . Dementia   . HTN (hypertension)   . Insomnia   . Paroxysmal atrial fibrillation [facility](HCC)     Past Surgical History:  Procedure Laterality Date  . INTRAMEDULLARY (IM) NAIL INTERTROCHANTERIC Right 01/06/2018   Procedure: INTRAMEDULLARY (IM) NAIL INTERTROCHANTRIC;  Surgeon: Signa KellPatel, Sunny, MD;  Location: [facility] ORS;  Service: Orthopedics;  Laterality: Right;  . NO PAST SURGERIES      Family History  Problem Relation Age of Onset  . Hypertension Other     Social History:  reports that he has never smoked. He has never used smokeless tobacco. He reports that he does not drink alcohol or use drugs.  Allergies  Allergen Reactions  . Penicillins Other (See Comments)    Patient passed out Has patient had a PCN reaction causing immediate rash, facial/tongue/throat swelling, SOB or lightheadedness with hypotension:YES Has patient had a PCN reaction causing Porter rash involving mucus membranes or skin necrosis: Unknown Has patient had a PCN reaction that required hospitalization: Unknown Has patient had a PCN reaction occurring within the last 10 years: No If all of the above answers are "NO", then may proceed with Cephalosporin use.  . Milk-Related Compounds Other (See Comments)    Unknown  . Tramadol Other (See Comments)    Unknown    Medications: I have reviewed the patient's current  medications.  ROS: Unable to obtain due to confusion   Physical Examination: Blood pressure 136/85, pulse (!) 52, temperature 98.4 F (36.9 C), temperature source Oral, resp. rate 20, height 5\' 11"  (1.803 m), weight 140 lb (63.5 kg), SpO2 97 %.    Neurological Examination   Mental Status: Alert to name only  Cranial Nerves: II: Discs flat bilaterally; Visual fields grossly normal, pupils equal, round, reactive to light and accommodation III,IV, VI: ptosis not present, extra-ocular motions intact bilaterally V,VII: smile symmetric, facial light touch sensation normal bilaterally VIII: hearing normal bilaterally IX,X: gag reflex present XI: bilateral shoulder shrug XII: midline tongue extension Motor: Right : Upper extremity   4/5    Left:     Upper extremity   4/5  Lower extremity   4/5     Lower extremity   4/5 Tone and bulk:normal tone throughout; no atrophy noted Sensory: Pinprick and light touch intact throughout, bilaterally Deep Tendon Reflexes: 1+ and symmetric throughout Plantars: Right: downgoing   Left: downgoing Cerebellar: Not tested.  Gait: not tested       Laboratory Studies:   Basic Metabolic Panel: Recent Labs  Lab 04/10/18 1138  NA 138  K 3.0*  CL 105  CO2 24  GLUCOSE 111*  BUN 13  CREATININE 0.80  CALCIUM 8.4*    Liver Function Tests: Recent Labs  Lab 04/10/18 1138  AST 22  ALT 10*  ALKPHOS 109  BILITOT 1.1  PROT 6.6  ALBUMIN 3.7   No  results for input(s): LIPASE, AMYLASE in the last 168 hours. No results for input(s): AMMONIA in the last 168 hours.  CBC: Recent Labs  Lab 04/10/18 1138  WBC 6.2  NEUTROABS 4.2  HGB 10.6*  HCT 32.6*  MCV 79.2*  PLT 154    Cardiac Enzymes: Recent Labs  Lab 04/10/18 1138  TROPONINI <0.03    BNP: Invalid input(s): POCBNP  CBG: No results for input(s): GLUCAP in the last 168 hours.  Microbiology: Results for orders placed or performed during the hospital encounter of 04/10/18   MRSA PCR Screening     Status: None   Collection Time: 04/10/18  7:34 PM  Result Value Ref Range Status   MRSA by PCR NEGATIVE NEGATIVE Final    Comment:        The GeneXpert MRSA Assay (FDA approved for NASAL specimens only), is one component of a comprehensive MRSA colonization surveillance program. It is not intended to diagnose MRSA infection nor to guide or monitor treatment for MRSA infections. Performed at [facility], [address] [place], [place] [address]     Coagulation Studies: No results for input(s): LABPROT, INR in the last 72 hours.  Urinalysis:  Recent Labs  Lab 04/10/18 1138  COLORURINE YELLOW*  LABSPEC 1.020  PHURINE 6.0  GLUCOSEU NEGATIVE  HGBUR NEGATIVE  BILIRUBINUR NEGATIVE  KETONESUR NEGATIVE  PROTEINUR 30*  NITRITE NEGATIVE  LEUKOCYTESUR NEGATIVE    Lipid Panel:     Component Value Date/Time   CHOL 143 04/11/2018 0450   TRIG 77 04/11/2018 0450   HDL 32 (L) 04/11/2018 0450   CHOLHDL 4.5 04/11/2018 0450   VLDL 15 04/11/2018 0450   LDLCALC 96 04/11/2018 0450    HgbA1C:  Lab Results  Component Value Date   HGBA1C 5.0 04/11/2018    Urine Drug Screen:  No results found for: LABOPIA, COCAINSCRNUR, LABBENZ, AMPHETMU, THCU, LABBARB  Alcohol Level: No results for input(s): ETH in the last 168 hours.  Other results: EKG: A-fib controlled rate.  Imaging: Ct Angio Head W Or Wo Contrast  Result Date: 04/10/2018 CLINICAL DATA:  83 y/o M; confusion and altered mental status. History of dementia. EXAM: CT ANGIOGRAPHY HEAD AND NECK TECHNIQUE: Multidetector CT imaging of the head and neck was performed using the standard protocol during bolus administration of intravenous contrast. Multiplanar CT image reconstructions and MIPs were obtained to evaluate the vascular anatomy. Carotid stenosis measurements (when applicable) are obtained utilizing NASCET criteria, using the distal internal carotid diameter as the denominator. CONTRAST:   75mL OMNIPAQUE IOHEXOL 350 MG/ML SOLN COMPARISON:  04/10/2018 CT head FINDINGS: CTA NECK FINDINGS Aortic arch: Standard branching. Imaged portion shows no evidence of aneurysm or dissection. No significant stenosis of the major arch vessel origins. Mild calcific atherosclerosis. Right carotid system: No evidence of dissection, stenosis (50% or greater) or occlusion. Left carotid system: No evidence of dissection, stenosis (50% or greater) or occlusion. Non stenotic calcific atherosclerosis of left carotid bifurcation. Vertebral arteries: Right dominant. No evidence of dissection, stenosis (50% or greater) or occlusion. Skeleton: Multilevel small anterior ossification compatible with DISH. At C3-4 spinous processes cerclage wires. Other neck: Negative. Upper chest: Smooth interlobular septal thickening in the lung apices compatible with mild interstitial pulmonary edema. Right upper lobe subcentimeter calcified granuloma. Review of the MIP images confirms the above findings CTA HEAD FINDINGS Anterior circulation: No significant stenosis, proximal occlusion, aneurysm, or vascular malformation. Calcific atherosclerosis of carotid siphons with mild left paraclinoid stenosis. Posterior circulation: No significant stenosis, proximal occlusion,  aneurysm, or vascular malformation. Venous sinuses: As permitted by contrast timing, patent. Anatomic variants: Small anterior communicating artery. No posterior communicating artery identified, likely hypoplastic or absent. Delayed phase: No abnormal intracranial enhancement. Review of the MIP images confirms the above findings IMPRESSION: 1. Patent carotid and vertebral arteries. No dissection, aneurysm, or hemodynamically significant stenosis utilizing NASCET criteria. 2. Patent anterior and posterior intracranial circulation. No large vessel occlusion, aneurysm, or significant stenosis. 3. Calcific atherosclerosis of aorta, carotid bifurcations, and carotid siphons.  Electronically Signed   By: Mitzi Hansen M.D.   On: 04/10/2018 16:48   Dg Chest 1 View  Result Date: 04/10/2018 CLINICAL DATA:  Altered mental status. EXAM: CHEST  1 VIEW COMPARISON:  02/23/2018 FINDINGS: The cardiac silhouette is moderately enlarged, similar to the prior study. Mild bronchitic changes are again noted. No airspace consolidation, edema, sizable pleural effusion, or pneumothorax is identified. An old right third rib fracture and GE junction region surgical clips are noted. IMPRESSION: Cardiomegaly without evidence of edema or pneumonia. Electronically Signed   By: Sebastian Ache M.D.   On: 04/10/2018 14:11   Ct Head Wo Contrast  Result Date: 04/10/2018 CLINICAL DATA:  Altered mental status. EXAM: CT HEAD WITHOUT CONTRAST TECHNIQUE: Contiguous axial images were obtained from the base of the skull through the vertex without intravenous contrast. COMPARISON:  02/22/2018 FINDINGS: Brain: There is no evidence of acute infarct, intracranial hemorrhage, mass, midline shift, or extra-axial fluid collection. Chronic lacunar infarcts are again seen in the left thalamus and posterior right lentiform. Patchy subcortical and periventricular white matter hypoattenuation bilaterally is unchanged and nonspecific but compatible with moderate chronic small vessel ischemic disease. Lateral and third ventriculomegaly is unchanged without particularly significant temporal horn dilatation, favored to represent moderately advanced central predominant cerebral atrophy over hydrocephalus. Vascular: Calcified atherosclerosis at the skull base. No hyperdense vessel. Skull: .  No fracture or suspicious osseous lesion Sinuses/Orbits: Visualized paranasal sinuses and mastoid air cells are clear. Visualized orbits are unremarkable. Other: None. IMPRESSION: 1. No evidence of acute intracranial abnormality. 2. Moderate chronic small vessel ischemic disease and cerebral atrophy. Electronically Signed   By: Sebastian Ache M.D.   On: 04/10/2018 13:55   Ct Angio Neck W Or Wo Contrast  Result Date: 04/10/2018 CLINICAL DATA:  83 y/o M; confusion and altered mental status. History of dementia. EXAM: CT ANGIOGRAPHY HEAD AND NECK TECHNIQUE: Multidetector CT imaging of the head and neck was performed using the standard protocol during bolus administration of intravenous contrast. Multiplanar CT image reconstructions and MIPs were obtained to evaluate the vascular anatomy. Carotid stenosis measurements (when applicable) are obtained utilizing NASCET criteria, using the distal internal carotid diameter as the denominator. CONTRAST:  75mL OMNIPAQUE IOHEXOL 350 MG/ML SOLN COMPARISON:  04/10/2018 CT head FINDINGS: CTA NECK FINDINGS Aortic arch: Standard branching. Imaged portion shows no evidence of aneurysm or dissection. No significant stenosis of the major arch vessel origins. Mild calcific atherosclerosis. Right carotid system: No evidence of dissection, stenosis (50% or greater) or occlusion. Left carotid system: No evidence of dissection, stenosis (50% or greater) or occlusion. Non stenotic calcific atherosclerosis of left carotid bifurcation. Vertebral arteries: Right dominant. No evidence of dissection, stenosis (50% or greater) or occlusion. Skeleton: Multilevel small anterior ossification compatible with DISH. At C3-4 spinous processes cerclage wires. Other neck: Negative. Upper chest: Smooth interlobular septal thickening in the lung apices compatible with mild interstitial pulmonary edema. Right upper lobe subcentimeter calcified granuloma. Review of the MIP images confirms the above findings CTA HEAD  FINDINGS Anterior circulation: No significant stenosis, proximal occlusion, aneurysm, or vascular malformation. Calcific atherosclerosis of carotid siphons with mild left paraclinoid stenosis. Posterior circulation: No significant stenosis, proximal occlusion, aneurysm, or vascular malformation. Venous sinuses: As permitted by  contrast timing, patent. Anatomic variants: Small anterior communicating artery. No posterior communicating artery identified, likely hypoplastic or absent. Delayed phase: No abnormal intracranial enhancement. Review of the MIP images confirms the above findings IMPRESSION: 1. Patent carotid and vertebral arteries. No dissection, aneurysm, or hemodynamically significant stenosis utilizing NASCET criteria. 2. Patent anterior and posterior intracranial circulation. No large vessel occlusion, aneurysm, or significant stenosis. 3. Calcific atherosclerosis of aorta, carotid bifurcations, and carotid siphons. Electronically Signed   By: Mitzi Hansen M.D.   On: 04/10/2018 16:48   US Carotid Bilateral (at [facility] Only)  Result Date: 04/10/2018 CLINICAL DATA:  83 y/o  M; TIA. EXAM: BILATERAL CAROTID DUPLEX ULTRASOUND TECHNIQUE: Wallace Cullens scale imaging, color Doppler and duplex ultrasound were performed of bilateral carotid and vertebral arteries in the neck. COMPARISON:  04/10/2018 CT angiogram neck. FINDINGS: Criteria: Quantification of carotid stenosis is based on velocity parameters that correlate the residual internal carotid diameter with NASCET-based stenosis levels, using the diameter of the distal internal carotid lumen as the denominator for stenosis measurement. The following velocity measurements were obtained: RIGHT ICA: 43 cm/sec CCA: 65 cm/sec SYSTOLIC ICA/CCA RATIO:  0.7 ECA:  59 cm/sec LEFT ICA: 56 cm/sec CCA: 71 cm/sec SYSTOLIC ICA/CCA RATIO:  0.8 ECA:  59 cm/sec RIGHT CAROTID ARTERY: Mild mixed plaque of carotid bifurcation. RIGHT VERTEBRAL ARTERY:  Antegrade. LEFT CAROTID ARTERY: Moderate predominantly calcified plaque at the carotid bifurcation. LEFT VERTEBRAL ARTERY:  Antegrade. IMPRESSION: 1. No hemodynamically significant stenosis of the carotid systems. 2. Mild right mixed plaque and moderate left calcified plaque of carotid bifurcations. 3. Bilateral vertebral artery antegrade flow.  Electronically Signed   By: Mitzi Hansen M.D.   On: 04/10/2018 18:11     Assessment/Plan:  83 y.o. male   known history of atrial fibrillation, dementia, prior UTIs presents to the hospital due to concern for slurred speech which has improved. AT baseline has dementia and oriented to self.   No focal weakness. CT scan of the head was negative. MRI of the brain unable to be done as can't lay flat and has cervical fusion .  Patient has history of atrial fibrillation but his anticoagulation was stopped in the past due to recurrent falls.  - Possibly at baseline - Frequent UtIs and hx of ESBL - Would hold of anticoagulation given  - no further imaging at this time.  04/11/2018, 1:31 PM

## 2018-04-11 NOTE — Progress Notes (Signed)
PT Cancellation Note  Patient Details Name: Trevor GlassingJohn Welke MRN: [id number] DOB: Jan 28, 1935   Cancelled Treatment:    Reason Eval/Treat Not Completed: Fatigue/lethargy limiting ability to participate(Consult received and chart reviewed.  Patient sleeping soundly upon arrival to room.  Mumbles minimally to touch, voice from therapist, but does not open eyes.  Attempted to wash face with cool cloth to improve alertness; unsuccessful (patient slightly with continued attempts).  Unable to achieve or maintain alertness to allow for appropriate participation for with PT evaluation.  Will continue efforts at later time/date as medically appropriate and able to participate).   Sibbie Flammia H. Manson PasseyBrown, PT, DPT, NCS 04/11/18, 11:22 AM [phone] [id number]

## 2018-04-11 NOTE — Progress Notes (Signed)
[employer] [facility] - [place] at [facility]   PATIENT NAME: Trevor Porter    MR#:  [id number]  DATE OF BIRTH:  1935/09/01  SUBJECTIVE:  CHIEF COMPLAINT:   Chief Complaint  Patient presents with  . Altered Mental Status   Confused. Not following instructions REVIEW OF SYSTEMS:    Review of Systems  Unable to perform ROS: Dementia   DRUG ALLERGIES:   Allergies  Allergen Reactions  . Penicillins Other (See Comments)    Patient passed out Has patient had a PCN reaction causing immediate rash, facial/tongue/throat swelling, SOB or lightheadedness with hypotension:YES Has patient had a PCN reaction causing severe rash involving mucus membranes or skin necrosis: Unknown Has patient had a PCN reaction that required hospitalization: Unknown Has patient had a PCN reaction occurring within the last 10 years: No If all of the above answers are "NO", then may proceed with Cephalosporin use.  . Milk-Related Compounds Other (See Comments)    Unknown  . Tramadol Other (See Comments)    Unknown    VITALS:  Blood pressure 136/85, pulse (!) 52, temperature 98.4 F (36.9 C), temperature source Oral, resp. rate 20, height 5\' 11"  (1.803 m), weight 63.5 kg (140 lb), SpO2 97 %.  PHYSICAL EXAMINATION:   Physical Exam  GENERAL:  83 y.o.-year-old patient lying in the bed with no acute distress.  EYES: Pupils equal, round, reactive to light and accommodation. No scleral icterus. Extraocular muscles intact.  HEENT: Head atraumatic, normocephalic. Oropharynx and nasopharynx clear.  NECK:  Supple, no jugular venous distention. No thyroid enlargement, no tenderness.  LUNGS: Normal breath sounds bilaterally, no wheezing, rales, rhonchi. No use of accessory muscles of respiration.  CARDIOVASCULAR: S1, S2 normal. No murmurs, rubs, or gallops.  ABDOMEN: Soft, nontender, nondistended. Bowel sounds present. No organomegaly or mass.  EXTREMITIES: No cyanosis, clubbing or edema b/l.     NEUROLOGIC:Not following instructions PSYCHIATRIC: The patient is drowzy, confused SKIN: No obvious rash, lesion, or ulcer.   LABORATORY PANEL:   CBC Recent Labs  Lab 04/10/18 1138  WBC 6.2  HGB 10.6*  HCT 32.6*  PLT 154   ------------------------------------------------------------------------------------------------------------------ Chemistries  Recent Labs  Lab 04/10/18 1138  NA 138  K 3.0*  CL 105  CO2 24  GLUCOSE 111*  BUN 13  CREATININE 0.80  CALCIUM 8.4*  AST 22  ALT 10*  ALKPHOS 109  BILITOT 1.1   ------------------------------------------------------------------------------------------------------------------  Cardiac Enzymes Recent Labs  Lab 04/10/18 1138  TROPONINI <0.03   ------------------------------------------------------------------------------------------------------------------  RADIOLOGY:  Ct Angio Head W Or Wo Contrast  Result Date: 04/10/2018 CLINICAL DATA:  83 y/o M; confusion and altered mental status. History of dementia. EXAM: CT ANGIOGRAPHY HEAD AND NECK TECHNIQUE: Multidetector CT imaging of the head and neck was performed using the standard protocol during bolus administration of intravenous contrast. Multiplanar CT image reconstructions and MIPs were obtained to evaluate the vascular anatomy. Carotid stenosis measurements (when applicable) are obtained utilizing NASCET criteria, using the distal internal carotid diameter as the denominator. CONTRAST:  75mL OMNIPAQUE IOHEXOL 350 MG/ML SOLN COMPARISON:  04/10/2018 CT head FINDINGS: CTA NECK FINDINGS Aortic arch: Standard branching. Imaged portion shows no evidence of aneurysm or dissection. No significant stenosis of the major arch vessel origins. Mild calcific atherosclerosis. Right carotid system: No evidence of dissection, stenosis (50% or greater) or occlusion. Left carotid system: No evidence of dissection, stenosis (50% or greater) or occlusion. Non stenotic calcific atherosclerosis  of left carotid bifurcation. Vertebral arteries: Right dominant. No evidence of dissection,  stenosis (50% or greater) or occlusion. Skeleton: Multilevel small anterior ossification compatible with DISH. At C3-4 spinous processes cerclage wires. Other neck: Negative. Upper chest: Smooth interlobular septal thickening in the lung apices compatible with mild interstitial pulmonary edema. Right upper lobe subcentimeter calcified granuloma. Review of the MIP images confirms the above findings CTA HEAD FINDINGS Anterior circulation: No significant stenosis, proximal occlusion, aneurysm, or vascular malformation. Calcific atherosclerosis of carotid siphons with mild left paraclinoid stenosis. Posterior circulation: No significant stenosis, proximal occlusion, aneurysm, or vascular malformation. Venous sinuses: As permitted by contrast timing, patent. Anatomic variants: Small anterior communicating artery. No posterior communicating artery identified, likely hypoplastic or absent. Delayed phase: No abnormal intracranial enhancement. Review of the MIP images confirms the above findings IMPRESSION: 1. Patent carotid and vertebral arteries. No dissection, aneurysm, or hemodynamically significant stenosis utilizing NASCET criteria. 2. Patent anterior and posterior intracranial circulation. No large vessel occlusion, aneurysm, or significant stenosis. 3. Calcific atherosclerosis of aorta, carotid bifurcations, and carotid siphons. Electronically Signed   By: Mitzi HansenLance  Furusawa-Stratton M.D.   On: 04/10/2018 16:48   Dg Chest 1 View  Result Date: 04/10/2018 CLINICAL DATA:  Altered mental status. EXAM: CHEST  1 VIEW COMPARISON:  02/23/2018 FINDINGS: The cardiac silhouette is moderately enlarged, similar to the prior study. Mild bronchitic changes are again noted. No airspace consolidation, edema, sizable pleural effusion, or pneumothorax is identified. An old right third rib fracture and GE junction region surgical clips are  noted. IMPRESSION: Cardiomegaly without evidence of edema or pneumonia. Electronically Signed   By: Sebastian AcheAllen  Grady M.D.   On: 04/10/2018 14:11   Ct Head Wo Contrast  Result Date: 04/10/2018 CLINICAL DATA:  Altered mental status. EXAM: CT HEAD WITHOUT CONTRAST TECHNIQUE: Contiguous axial images were obtained from the base of the skull through the vertex without intravenous contrast. COMPARISON:  02/22/2018 FINDINGS: Brain: There is no evidence of acute infarct, intracranial hemorrhage, mass, midline shift, or extra-axial fluid collection. Chronic lacunar infarcts are again seen in the left thalamus and posterior right lentiform. Patchy subcortical and periventricular white matter hypoattenuation bilaterally is unchanged and nonspecific but compatible with moderate chronic small vessel ischemic disease. Lateral and third ventriculomegaly is unchanged without particularly significant temporal horn dilatation, favored to represent moderately advanced central predominant cerebral atrophy over hydrocephalus. Vascular: Calcified atherosclerosis at the skull base. No hyperdense vessel. Skull: .  No fracture or suspicious osseous lesion Sinuses/Orbits: Visualized paranasal sinuses and mastoid air cells are clear. Visualized orbits are unremarkable. Other: None. IMPRESSION: 1. No evidence of acute intracranial abnormality. 2. Moderate chronic small vessel ischemic disease and cerebral atrophy. Electronically Signed   By: Sebastian AcheAllen  Grady M.D.   On: 04/10/2018 13:55   Ct Angio Neck W Or Wo Contrast  Result Date: 04/10/2018 CLINICAL DATA:  83 y/o M; confusion and altered mental status. History of dementia. EXAM: CT ANGIOGRAPHY HEAD AND NECK TECHNIQUE: Multidetector CT imaging of the head and neck was performed using the standard protocol during bolus administration of intravenous contrast. Multiplanar CT image reconstructions and MIPs were obtained to evaluate the vascular anatomy. Carotid stenosis measurements (when  applicable) are obtained utilizing NASCET criteria, using the distal internal carotid diameter as the denominator. CONTRAST:  75mL OMNIPAQUE IOHEXOL 350 MG/ML SOLN COMPARISON:  04/10/2018 CT head FINDINGS: CTA NECK FINDINGS Aortic arch: Standard branching. Imaged portion shows no evidence of aneurysm or dissection. No significant stenosis of the major arch vessel origins. Mild calcific atherosclerosis. Right carotid system: No evidence of dissection, stenosis (50% or greater) or occlusion.  Left carotid system: No evidence of dissection, stenosis (50% or greater) or occlusion. Non stenotic calcific atherosclerosis of left carotid bifurcation. Vertebral arteries: Right dominant. No evidence of dissection, stenosis (50% or greater) or occlusion. Skeleton: Multilevel small anterior ossification compatible with DISH. At C3-4 spinous processes cerclage wires. Other neck: Negative. Upper chest: Smooth interlobular septal thickening in the lung apices compatible with mild interstitial pulmonary edema. Right upper lobe subcentimeter calcified granuloma. Review of the MIP images confirms the above findings CTA HEAD FINDINGS Anterior circulation: No significant stenosis, proximal occlusion, aneurysm, or vascular malformation. Calcific atherosclerosis of carotid siphons with mild left paraclinoid stenosis. Posterior circulation: No significant stenosis, proximal occlusion, aneurysm, or vascular malformation. Venous sinuses: As permitted by contrast timing, patent. Anatomic variants: Small anterior communicating artery. No posterior communicating artery identified, likely hypoplastic or absent. Delayed phase: No abnormal intracranial enhancement. Review of the MIP images confirms the above findings IMPRESSION: 1. Patent carotid and vertebral arteries. No dissection, aneurysm, or hemodynamically significant stenosis utilizing NASCET criteria. 2. Patent anterior and posterior intracranial circulation. No large vessel occlusion,  aneurysm, or significant stenosis. 3. Calcific atherosclerosis of aorta, carotid bifurcations, and carotid siphons. Electronically Signed   By: Mitzi Hansen M.D.   On: 04/10/2018 16:48   US Carotid Bilateral (at [facility] Only)  Result Date: 04/10/2018 CLINICAL DATA:  83 y/o  M; TIA. EXAM: BILATERAL CAROTID DUPLEX ULTRASOUND TECHNIQUE: Wallace Cullens scale imaging, color Doppler and duplex ultrasound were performed of bilateral carotid and vertebral arteries in the neck. COMPARISON:  04/10/2018 CT angiogram neck. FINDINGS: Criteria: Quantification of carotid stenosis is based on velocity parameters that correlate the residual internal carotid diameter with NASCET-based stenosis levels, using the diameter of the distal internal carotid lumen as the denominator for stenosis measurement. The following velocity measurements were obtained: RIGHT ICA: 43 cm/sec CCA: 65 cm/sec SYSTOLIC ICA/CCA RATIO:  0.7 ECA:  59 cm/sec LEFT ICA: 56 cm/sec CCA: 71 cm/sec SYSTOLIC ICA/CCA RATIO:  0.8 ECA:  59 cm/sec RIGHT CAROTID ARTERY: Mild mixed plaque of carotid bifurcation. RIGHT VERTEBRAL ARTERY:  Antegrade. LEFT CAROTID ARTERY: Moderate predominantly calcified plaque at the carotid bifurcation. LEFT VERTEBRAL ARTERY:  Antegrade. IMPRESSION: 1. No hemodynamically significant stenosis of the carotid systems. 2. Mild right mixed plaque and moderate left calcified plaque of carotid bifurcations. 3. Bilateral vertebral artery antegrade flow. Electronically Signed   By: Mitzi Hansen M.D.   On: 04/10/2018 18:11     ASSESSMENT AND PLAN:   *TIA. Stroke order set. Neuro checks. Aspirin, statin. Unable to do MRI. CT head negative.  Check echocardiogram. Consulted neurology.  *Hypertension. Continue home medications. Added IV hydralazine.  *Dementia. Monitor for inpatient delirium.  Paroxysmal atrial fibrillation. Patient on aspirin. Anticoagulation stopped in the past due to recurrent falls.  Will wait  for neurology input  All the records are reviewed and case discussed with Care Management/Social Workerr. Management plans discussed with the patient, family and they are in agreement.  CODE STATUS: DNR  DVT Prophylaxis: SCDs  TOTAL TIME TAKING CARE OF THIS PATIENT: 30 minutes.   POSSIBLE D/C IN 1-2 DAYS, DEPENDING ON CLINICAL CONDITION.  Orie Fisherman M.D on 04/11/2018 at 11:41 AM  Between 7am to 6pm - Pager - [phone]  After 6pm go to [email or web address] - password [email or web address] [facility]  [facility]  Office  [phone]  CC: Primary care physician; Keane Police, MD  Note: This dictation was prepared with Dragon dictation along with smaller phrase technology. Any transcriptional errors that result from this  process are unintentional.

## 2018-04-11 NOTE — Progress Notes (Signed)
Patient has not had any output since this morning, bladder scanned patient yielded 255 cc, Also patient has Metoprolol due however patients HR has been running a-fib 40s-50's on tele Per Dr. Elpidio AnisSudini hold metoprolol, monitor output and start 0.9 % NS at 8050ml/hr continuous.  Will continue to monitor closely.

## 2018-04-11 NOTE — Care Management Note (Signed)
Case Management Note  Patient Details  Name: Trevor GlassingJohn Porter MRN: [id number] Date of Birth: February 11, 1935  Subjective/Objective:          Patient admitted to [facility] under observation status for TIA. RNCM consulted on patient to provide MOON letter and complete assessment. Patient with history of dementia and is unable to complete assessment questions. RNCM spoke with Trevor LeatherwoodMary Porter [phone] to complete assessment. [facility] and daughter expresses concerns over her fathers safety at this facility and it is very upset with the quality of care. RNCM will speak to CSW. Daughter is over an hour away and it is difficult to provides the help she would like to provide. [facility].            Action/Plan:  RNCM to continue to follow for any needs. Will notify CSW of situation.   Expected Discharge Date:                  Expected Discharge Plan:     In-House Referral:     Discharge planning Services     Post Acute Care Choice:    Choice offered to:     DME Arranged:    DME Agency:     HH Arranged:    HH Agency:     Status of Service:     If discussed at [employer] Meetings, dates discussed:    Additional Comments:  Virgel ManifoldJosh A Keidan Aumiller, RN 04/11/2018, 9:21 AM

## 2018-04-11 NOTE — Care Management Obs Status (Signed)
MEDICARE OBSERVATION STATUS NOTIFICATION   Patient Details  Name: Trevor GlassingJohn Porter MRN: [id number] Date of Birth: March 25, 1935   Medicare Observation Status Notification Given:  Yes  Spoke with daughter to provide MOON letter. Marked her signature with an "X" to document her understanding  Virgel ManifoldJosh A Donnesha Karg, RN 04/11/2018, 1:01 PM

## 2018-04-12 DIAGNOSIS — Z7189 Other specified counseling: Secondary | ICD-10-CM | POA: Diagnosis not present

## 2018-04-12 DIAGNOSIS — Z791 Long term (current) use of non-steroidal anti-inflammatories (NSAID): Secondary | ICD-10-CM | POA: Diagnosis not present

## 2018-04-12 DIAGNOSIS — Z66 Do not resuscitate: Secondary | ICD-10-CM | POA: Diagnosis present

## 2018-04-12 DIAGNOSIS — R197 Diarrhea, unspecified: Secondary | ICD-10-CM | POA: Diagnosis present

## 2018-04-12 DIAGNOSIS — Z515 Encounter for palliative care: Secondary | ICD-10-CM | POA: Diagnosis not present

## 2018-04-12 DIAGNOSIS — G9341 Metabolic encephalopathy: Secondary | ICD-10-CM | POA: Diagnosis present

## 2018-04-12 DIAGNOSIS — E739 Lactose intolerance, unspecified: Secondary | ICD-10-CM | POA: Diagnosis present

## 2018-04-12 DIAGNOSIS — R4182 Altered mental status, unspecified: Secondary | ICD-10-CM | POA: Diagnosis not present

## 2018-04-12 DIAGNOSIS — Z880 Allergy status to penicillin: Secondary | ICD-10-CM | POA: Diagnosis not present

## 2018-04-12 DIAGNOSIS — F0391 Unspecified dementia with behavioral disturbance: Secondary | ICD-10-CM | POA: Diagnosis not present

## 2018-04-12 DIAGNOSIS — Z7982 Long term (current) use of aspirin: Secondary | ICD-10-CM | POA: Diagnosis not present

## 2018-04-12 DIAGNOSIS — E876 Hypokalemia: Secondary | ICD-10-CM | POA: Diagnosis present

## 2018-04-12 DIAGNOSIS — I480 Paroxysmal atrial fibrillation: Secondary | ICD-10-CM | POA: Diagnosis present

## 2018-04-12 DIAGNOSIS — R4781 Slurred speech: Secondary | ICD-10-CM | POA: Diagnosis present

## 2018-04-12 DIAGNOSIS — Z885 Allergy status to narcotic agent status: Secondary | ICD-10-CM | POA: Diagnosis not present

## 2018-04-12 DIAGNOSIS — G459 Transient cerebral ischemic attack, unspecified: Secondary | ICD-10-CM | POA: Diagnosis present

## 2018-04-12 DIAGNOSIS — Z79899 Other long term (current) drug therapy: Secondary | ICD-10-CM | POA: Diagnosis not present

## 2018-04-12 DIAGNOSIS — R32 Unspecified urinary incontinence: Secondary | ICD-10-CM | POA: Diagnosis present

## 2018-04-12 DIAGNOSIS — Z8249 Family history of ischemic heart disease and other diseases of the circulatory system: Secondary | ICD-10-CM | POA: Diagnosis not present

## 2018-04-12 DIAGNOSIS — Z87440 Personal history of urinary (tract) infections: Secondary | ICD-10-CM | POA: Diagnosis not present

## 2018-04-12 DIAGNOSIS — R296 Repeated falls: Secondary | ICD-10-CM | POA: Diagnosis present

## 2018-04-12 DIAGNOSIS — I10 Essential (primary) hypertension: Secondary | ICD-10-CM | POA: Diagnosis present

## 2018-04-12 DIAGNOSIS — R29703 NIHSS score 3: Secondary | ICD-10-CM | POA: Diagnosis present

## 2018-04-12 DIAGNOSIS — Z981 Arthrodesis status: Secondary | ICD-10-CM | POA: Diagnosis not present

## 2018-04-12 DIAGNOSIS — G4700 Insomnia, unspecified: Secondary | ICD-10-CM | POA: Diagnosis present

## 2018-04-12 LAB — BASIC METABOLIC PANEL
ANION GAP: 10 (ref 5–15)
BUN: 14 mg/dL (ref 6–20)
CALCIUM: 8.6 mg/dL — AB (ref 8.9–10.3)
CO2: 22 mmol/L (ref 22–32)
Chloride: 102 mmol/L (ref 101–111)
Creatinine, Ser: 0.68 mg/dL (ref 0.61–1.24)
GLUCOSE: 161 mg/dL — AB (ref 65–99)
Potassium: 2.9 mmol/L — ABNORMAL LOW (ref 3.5–5.1)
SODIUM: 134 mmol/L — AB (ref 135–145)

## 2018-04-12 LAB — MAGNESIUM: Magnesium: 1.6 mg/dL — ABNORMAL LOW (ref 1.7–2.4)

## 2018-04-12 MED ORDER — POTASSIUM CHLORIDE CRYS ER 20 MEQ PO TBCR
40.0000 meq | EXTENDED_RELEASE_TABLET | Freq: Once | ORAL | Status: AC
  Administered 2018-04-12: 40 meq via ORAL
  Filled 2018-04-12: qty 2

## 2018-04-12 MED ORDER — ATORVASTATIN CALCIUM 20 MG PO TABS
40.0000 mg | ORAL_TABLET | Freq: Every day | ORAL | Status: DC
  Administered 2018-04-12 – 2018-04-13 (×2): 40 mg via ORAL
  Filled 2018-04-12 (×2): qty 2

## 2018-04-12 MED ORDER — MAGNESIUM SULFATE 2 GM/50ML IV SOLN
2.0000 g | Freq: Once | INTRAVENOUS | Status: AC
  Administered 2018-04-12: 2 g via INTRAVENOUS
  Filled 2018-04-12: qty 50

## 2018-04-12 NOTE — Evaluation (Signed)
Occupational Therapy Evaluation Patient Details Name: Trevor Porter MRN: [id number] DOB: 09-May-1935 Today's Date: 04/12/2018    History of Present Illness 83 y.o. male with PMHx including atrial fibrillation, dementia, prior UTIs, recurrent falls presents to the hospital due to concern for slurred speech which has improved. CT scan of the head was negative. MRI of the brain unable to be done as can't lay flat and has cervical fusion.  Pt's anticoagulation was stopped in the past due to recurrent falls.   Clinical Impression   Pt seen for OT evaluation this date. Pt asleep but wakes to OT's voice and light tactile cues. Spoke with dtr who was able to provide additional detail regarding PLOF/home set up, as pt is a poor historian. Prior to hospital admission, pt was living at [facility] in LTC and receiving occasional restorative services, per dtr by phone. Dtr states pt had received STR there after a hip fx in March but has been at wheelchair level since the last hip fx. She also states that pt required assist for transfers from the w/c to/from bed, toilet, etc. Per dtr, pt requires varying levels of assist for toileting, bathing, and dressing. (Per pt, he lives with his dtr and is independent with mobility, basic ADL, and driving.) Pt currently requires min-mod assist for bed mobility and CGA to mod assist for sitting balance 2:2 R lateral lean (per dtr has had this lean prior to admission). While seated EOB, pt was able to perform grooming tasks with set up and occasional verbal and physical assist to correct for R lat lean as well as some dynamic weight shifting and reaching to improve sitting balance within grooming tasks, aside from evaluation. Pt able to follow simple commands with cues and additional time to initiate and complete tasks. Occasional cues for sequencing. Max assist for LB dressing while seated EOB. Pt demonstrates good strength bilaterally, no sensory deficits noted. Pt demonstrates  impairments in cognition, activity tolerance, balance, and safety awareness resulting in increased assist required for mobility and ADL tasks to maximize safety. Pt would benefit from skilled OT to address noted impairments and functional limitations (see below for any additional details) in order to maximize safety and independence while minimizing falls risk and caregiver burden.  Upon hospital discharge, recommend pt discharge to STR with focus on balance, cognition, and activity tolerance to maximize independence, safety, and minimize future falls, readmissions, and caregiver burden.    Follow Up Recommendations  SNF;Supervision/Assistance - 24 hour    Equipment Recommendations  Other (comment)(TBD)    Recommendations for Other Services       Precautions / Restrictions Precautions Precautions: Fall Restrictions Weight Bearing Restrictions: No      Mobility Bed Mobility Overal bed mobility: Needs Assistance Bed Mobility: Supine to Sit;Sit to Supine     Supine to sit: Min assist;Mod assist;HOB elevated Sit to supine: Mod assist   General bed mobility comments: physical assist for BLE mgt and for trunk/UB support for sup<>sit with cues for hand placement to improve performance  Transfers Overall transfer level: Needs assistance Equipment used: None Transfers: Lateral/Scoot Transfers          Lateral/Scoot Transfers: Min assist;Mod assist General transfer comment: verbal cues for initiating    Balance Overall balance assessment: Needs assistance Sitting-balance support: Single extremity supported;Bilateral upper extremity supported;No upper extremity supported;Feet supported Sitting balance-Leahy Scale: Poor Sitting balance - Comments: requires CGA to mod assist t/o session to correct for R lateral lean (per dtr, R lat lean  at baseline), at times is able to self correct with verbal cues  Postural control: Right lateral lean                                  ADL either performed or assessed with clinical judgement   ADL Overall ADL's : Needs assistance/impaired     Grooming: Sitting;Oral care;Wash/dry face;Set up;Minimal assistance;Moderate assistance Grooming Details (indicate cue type and reason): set up of items, min-mod assist and verbal cues for sitting balance with R lat lean Upper Body Bathing: Sitting;Minimal assistance;Moderate assistance   Lower Body Bathing: Sitting/lateral leans;Moderate assistance;Maximal assistance   Upper Body Dressing : Sitting;Minimal assistance;Moderate assistance   Lower Body Dressing: Sitting/lateral leans;Moderate assistance;Maximal assistance   Toilet Transfer: Maximal assistance;BSC;Stand-pivot;Cueing for sequencing;Cueing for safety                   Vision Baseline Vision/History: No visual deficits Patient Visual Report: No change from baseline Vision Assessment?: No apparent visual deficits     Perception     Praxis      Pertinent Vitals/Pain Pain Assessment: No/denies pain     Hand Dominance Right   Extremity/Trunk Assessment Upper Extremity Assessment Upper Extremity Assessment: Overall WFL for tasks assessed(BUE strength 5/5 bilaterally with isolated MMT, coordination intact, denies sensory deficits)   Lower Extremity Assessment Lower Extremity Assessment: Defer to PT evaluation;Overall WFL for tasks assessed(grossly 5/5 bilaterally with isolated MMT, pt denies sensory deficits, gross coordination intact)   Cervical / Trunk Assessment Cervical / Trunk Assessment: Kyphotic   Communication Communication Communication: No difficulties   Cognition Arousal/Alertness: Awake/alert Behavior During Therapy: WFL for tasks assessed/performed Overall Cognitive Status: History of cognitive impairments - at baseline                                 General Comments: Pt oriented to self, place, and generally to situation. Difficulty with PLOF questions with  contradictory responses. Pleasant, able to follow simple commands with cues and time. Additional time to initiate and problem solve.   General Comments       Exercises     Shoulder Instructions      Home Living Family/patient expects to be discharged to:: Skilled nursing facility                                 Additional Comments: Per pt he lives with his dtr; Per dtr by phone, pt from [facility] since Oct 2018 where he recent completed STR (hip fx 3/19) and is now LTC receiving infrequent restorative services       Prior Functioning/Environment Level of Independence: Needs assistance  Gait / Transfers Assistance Needed: Per pt he was ambulating; Per dtr by phone, pt has not been ambulatory since previous hip fx, w/c level and requires assist for SPT from w/c to toilet/bed. Pt uses BLE to propel w/c. Dtr endorses multiple falls over past year (leading to previous hip fractures) ADL's / Homemaking Assistance Needed: Per pt he is indep with dressing; Per dtr by phone pt requires assist for all ADL and IADL from staff            OT Problem List: Decreased knowledge of use of DME or AE;Decreased activity tolerance;Decreased cognition;Cardiopulmonary status limiting activity;Decreased safety awareness;Impaired balance (sitting and/or standing)  OT Treatment/Interventions: Self-care/ADL training;Balance training;Therapeutic exercise;Therapeutic activities;DME and/or AE instruction;Patient/family education;Cognitive remediation/compensation    OT Goals(Current goals can be found in the care plan section) Acute Rehab OT Goals Patient Stated Goal: "to get better" OT Goal Formulation: With patient/family Time For Goal Achievement: 04/26/18 Potential to Achieve Goals: Good ADL Goals Pt Will Perform Upper Body Dressing: with supervision;sitting(VC for safety/balance) Pt Will Perform Lower Body Dressing: with min assist;sit to/from stand Pt Will Transfer to Toilet: with  min assist;stand pivot transfer(with min assist for w/c<>commode w/ rails t/f)  OT Frequency: Min 2X/week   Barriers to D/C:            Co-evaluation              AM-PAC PT "6 Clicks" Daily Activity     Outcome Measure Help from another person eating meals?: A Little Help from another person taking care of personal grooming?: A Little Help from another person toileting, which includes using toliet, bedpan, or urinal?: A Lot Help from another person bathing (including washing, rinsing, drying)?: A Lot Help from another person to put on and taking off regular upper body clothing?: A Lot Help from another person to put on and taking off regular lower body clothing?: A Lot 6 Click Score: 14   End of Session    Activity Tolerance: Patient tolerated treatment well Patient left: in bed;with call bell/phone within reach;with bed alarm set  OT Visit Diagnosis: Other abnormalities of gait and mobility (R26.89);Repeated falls (R29.6);Other symptoms and signs involving cognitive function                Time: 1010-1040 OT Time Calculation (min): 30 min Charges:  OT General Charges $OT Visit: 1 Visit OT Evaluation $OT Eval Moderate Complexity: 1 Mod OT Treatments $Self Care/Home Management : 8-22 mins   Richrd PrimeJamie Stiller, MPH, MS, OTR/L ascom [phone] 04/12/18, 11:42 AM

## 2018-04-12 NOTE — Clinical Social Work Note (Signed)
Clinical Social Work Assessment  Patient Details  Name: Trevor GlassingJohn Porter MRN: [id number] Date of Birth: [birth year]  Date of referral:  04/12/18               Reason for consult:  Other (Comment Required)([facility] )                Permission sought to share information with:  Oceanographeracility Contact Representative Permission granted to share information::  Yes, Verbal Permission Granted  Name::      [facility] [place]   Agency::     Relationship::     Contact Information:     Housing/Transportation Living arrangements for the past 2 months:  Skilled Building surveyorursing Facility Source of Information:  Adult Children, Facility Patient Interpreter Needed:  None Criminal Activity/Legal Involvement Pertinent to Current Situation/Hospitalization:  No - Comment as needed Significant Relationships:  Adult Children Lives with:  Facility Resident Do you feel safe going back to the place where you live?  Yes Need for family participation in patient care:  Yes (Comment)  Care giving concerns:  [facility].    Social Worker assessment / plan:  Visual merchandiserClinical Social Worker (CSW) received consult from MD that patient's daughter Trevor Porter wants a new SNF. Per Trevor Porter admissions [facility] patient is a long term care [facility] [place] and can return when medically stable. CSW contacted patient's daughter Trevor Porter and made her aware that a new SNF will have to be arranged through the [place]. [facility] and she will have to work on other facility options with the [place]. Daughter verbalized her understanding and is in agreement with plan. FL2 complete. Per daughter the [facility] has been working on other placement options and has been unsuccessful. CSW will continue to follow and assist as needed.   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   Patient/Family's Understanding of and Emotional Response to Diagnosis, Current Treatment, and Prognosis:  Patient's daughter was very pleasant and thanked CSW for assistance.   Emotional Assessment Appearance:  Appears stated age Attitude/Demeanor/Rapport:  Unable to Assess Affect (typically observed):  Unable to Assess Orientation:  Oriented to Self, Fluctuating Orientation (Suspected and/or reported Sundowners) Alcohol / Substance use:  Not Applicable Psych involvement (Current and /or in the community):  No (Comment)  Discharge Needs  Concerns to be addressed:  Discharge Planning Concerns Readmission within the last 30 days:  No Current discharge risk:  Dependent with Mobility, Chronically ill Barriers to Discharge:  Continued Medical Work up   [employer], Trevor CrockerBailey M, LCSW 04/12/2018, 5:33 PM

## 2018-04-12 NOTE — Progress Notes (Signed)
[facility] [place] at [facility]   PATIENT NAME: Trevor Porter    MR#:  [id number]  DATE OF BIRTH:  September 28, 1935  SUBJECTIVE:  CHIEF COMPLAINT:   Chief Complaint  Patient presents with  . Altered Mental Status   Confused. Awake. Working with ST when I saw him REVIEW OF SYSTEMS:    Review of Systems  Unable to perform ROS: Dementia  HENT: Congestion:      DRUG ALLERGIES:   Allergies  Allergen Reactions  . Penicillins Other (See Comments)    Patient passed out Has patient had a PCN reaction causing immediate rash, facial/tongue/throat swelling, SOB or lightheadedness with hypotension:YES Has patient had a PCN reaction causing severe rash involving mucus membranes or skin necrosis: Unknown Has patient had a PCN reaction that required hospitalization: Unknown Has patient had a PCN reaction occurring within the last 10 years: No If all of the above answers are "NO", then may proceed with Cephalosporin use.  . Milk-Related Compounds Other (See Comments)    Unknown  . Tramadol Other (See Comments)    Unknown    VITALS:  Blood pressure (!) 158/81, pulse 95, temperature 98.5 F (36.9 C), temperature source Oral, resp. rate 18, height 5\' 11"  (1.803 m), weight 63.5 kg (140 lb), SpO2 95 %.  PHYSICAL EXAMINATION:   Physical Exam  GENERAL:  83 y.o.-year-old patient lying in the bed with no acute distress.  EYES: Pupils equal, round, reactive to light and accommodation. No scleral icterus. Extraocular muscles intact.  HEENT: Head atraumatic, normocephalic. Oropharynx and nasopharynx clear.  NECK:  Supple, no jugular venous distention. No thyroid enlargement, no tenderness.  LUNGS: Normal breath sounds bilaterally, no wheezing, rales, rhonchi. No use of accessory muscles of respiration.  CARDIOVASCULAR: S1, S2 normal. No murmurs, rubs, or gallops.  ABDOMEN: Soft, nontender, nondistended. Bowel sounds present. No organomegaly or mass.  EXTREMITIES: No cyanosis,  clubbing or edema b/l.    NEUROLOGIC:Not following instructions PSYCHIATRIC: The patient is awake but pleasantly confused SKIN: No obvious rash, lesion, or ulcer.   LABORATORY PANEL:   CBC Recent Labs  Lab 04/10/18 1138  WBC 6.2  HGB 10.6*  HCT 32.6*  PLT 154   ------------------------------------------------------------------------------------------------------------------ Chemistries  Recent Labs  Lab 04/10/18 1138 04/12/18 1419  NA 138 134*  K 3.0* 2.9*  CL 105 102  CO2 24 22  GLUCOSE 111* 161*  BUN 13 14  CREATININE 0.80 0.68  CALCIUM 8.4* 8.6*  MG  --  1.6*  AST 22  --   ALT 10*  --   ALKPHOS 109  --   BILITOT 1.1  --    ------------------------------------------------------------------------------------------------------------------  Cardiac Enzymes Recent Labs  Lab 04/10/18 1138  TROPONINI <0.03   ------------------------------------------------------------------------------------------------------------------  RADIOLOGY:  No results found.   ASSESSMENT AND PLAN:  83 y.o. male  known history of atrial fibrillation, dementia, prior UTIs presents to the hospital due to concern for slurred speech which has improved.   *TIA. -  AT baseline has dementia and oriented to self.   No focal weakness. CT scan of the head was negative. MRI of the brain unable to be done as can't lay flat and has cervical fusion .  Patient has history of atrial fibrillation but his anticoagulation was stopped in the past due to recurrent falls. - Possibly at baseline - Frequent UtIs and hx of ESBL - Would hold of anticoagulation given above - no further imaging at this time per Neuro  * Severe hypokalemia - replete  and recheck, check mg  *Hypertension. Continue home medications. Added IV hydralazine.  *Dementia. Monitor for inpatient delirium.  Paroxysmal atrial fibrillation. Patient on aspirin. Anticoagulation stopped in the past due to recurrent falls.   OT  recommends SNF   All the records are reviewed and case discussed with Care Management/Social Workerr. Management plans discussed with the patient, nursing and they are in agreement.  CODE STATUS: DNR  DVT Prophylaxis: SCDs  TOTAL TIME TAKING CARE OF THIS PATIENT: 30 minutes.   POSSIBLE D/C IN 1-2 DAYS, DEPENDING ON CLINICAL CONDITION.  Delfino LovettVipul Lilo Wallington M.D on 04/12/2018 at 9:18 PM  Between 7am to 6pm - Pager - [phone]  After 6pm go to [email or web address] - password [email or web address] [facility]  [facility]  Office  [phone]  CC: Primary care physician; Keane PoliceSlade-Hartman, Venezela, MD  Note: This dictation was prepared with Dragon dictation along with smaller phrase technology. Any transcriptional errors that result from this process are unintentional.

## 2018-04-12 NOTE — [place] FL2 (Signed)
[place] MEDICAID FL2 LEVEL OF CARE SCREENING TOOL     IDENTIFICATION  Patient Name: Trevor Porter Birthdate: 1935-06-12 Sex: male Admission Date (Current Location): 04/10/2018  [place] and [place] Number:  Chiropodist and Address:  [facility], [address] [place], [place] [address]      Provider Number: [id number]  Attending Physician Name and Address:  Delfino Lovett, MD  Relative Name and Phone Number:       Current Level of Care: Hospital Recommended Level of Care: Skilled Nursing Facility Prior Approval Number:    Date Approved/Denied:   PASRR Number: [id number])  Discharge Plan: SNF    Current Diagnoses: Patient Active Problem List   Diagnosis Date Noted  . Acute metabolic encephalopathy 04/12/2018  . TIA (transient ischemic attack) 04/10/2018  . Acute encephalopathy 01/13/2018  . Closed right hip fracture (HCC) 01/06/2018  . AF (paroxysmal atrial fibrillation) (HCC) 01/06/2018  . HTN (hypertension) 01/06/2018  . Insomnia 01/06/2018    Orientation RESPIRATION BLADDER Height & Weight     Self  Normal Incontinent Weight: 140 lb (63.5 kg) Height:  5\' 11"  (180.3 cm)  BEHAVIORAL SYMPTOMS/MOOD NEUROLOGICAL BOWEL NUTRITION STATUS      Incontinent Diet(Diet: DYS 3 )  AMBULATORY STATUS COMMUNICATION OF NEEDS Skin   Extensive Assist Verbally Normal                       Personal Care Assistance Level of Assistance  Bathing, Feeding, Dressing Bathing Assistance: Limited assistance Feeding assistance: Independent Dressing Assistance: Limited assistance     Functional Limitations Info  Sight, Hearing, Speech Sight Info: Adequate Hearing Info: Adequate Speech Info: Adequate    SPECIAL CARE FACTORS FREQUENCY  PT (By licensed PT)     PT Frequency: (5)              Contractures      Additional Factors Info  Code Status, Allergies, Isolation Precautions Code Status Info: (DNR ) Allergies Info:  (Penicillins, Milk-related Compounds, Tramadol)     Isolation Precautions Info: (history of ESBL)     Current Medications (04/12/2018):  This is the current hospital active medication list Current Facility-Administered Medications  Medication Dose Route Frequency Provider Last Rate Last Dose  .  stroke: mapping our early stages of recovery book   Does not apply Once Sudini, Wardell Heath, MD      . 0.9 %  sodium chloride infusion   Intravenous Continuous Milagros Loll, MD 50 mL/hr at 04/12/18 1710    . acetaminophen (TYLENOL) tablet 650 mg  650 mg Oral Q4H PRN Milagros Loll, MD       Or  . acetaminophen (TYLENOL) solution 650 mg  650 mg Per Tube Q4H PRN Milagros Loll, MD       Or  . acetaminophen (TYLENOL) suppository 650 mg  650 mg Rectal Q4H PRN Sudini, Wardell Heath, MD      . amLODipine (NORVASC) tablet 2.5 mg  2.5 mg Oral Daily Milagros Loll, MD   2.5 mg at 04/12/18 0859  . aspirin suppository 300 mg  300 mg Rectal Daily Sudini, Wardell Heath, MD       Or  . aspirin tablet 325 mg  325 mg Oral Daily Sudini, Wardell Heath, MD   325 mg at 04/12/18 0900  . atorvastatin (LIPITOR) tablet 40 mg  40 mg Oral q1800 Delfino Lovett, MD   40 mg at 04/12/18 1710  . carvedilol (COREG) tablet 25 mg  25 mg Oral BID WC Sudini,  Srikar, MD   25 mg at 04/12/18 0859  . enoxaparin (LOVENOX) injection 40 mg  40 mg Subcutaneous Q24H Milagros LollSudini, Srikar, MD   40 mg at 04/12/18 1628  . guaiFENesin-dextromethorphan (ROBITUSSIN DM) 100-10 MG/5ML syrup 5 mL  5 mL Oral Q4H PRN Milagros LollSudini, Srikar, MD   5 mL at 04/11/18 1752  . hydrALAZINE (APRESOLINE) injection 10 mg  10 mg Intravenous Q6H PRN Sudini, Srikar, MD      . magnesium sulfate IVPB 2 g 50 mL  2 g Intravenous Once Marty HeckWang, Hannah L, RPH 50 mL/hr at 04/12/18 1709 2 g at 04/12/18 1709  . Melatonin TABS 2.5 mg  2.5 mg Oral QHS Milagros LollSudini, Srikar, MD   2.5 mg at 04/11/18 2333  . oxyCODONE (Oxy IR/ROXICODONE) immediate release tablet 2.5 mg  2.5 mg Oral Q6H PRN Milagros LollSudini, Srikar, MD   2.5 mg at 04/10/18 2110   . potassium chloride SA (K-DUR,KLOR-CON) CR tablet 40 mEq  40 mEq Oral Once Marty HeckWang, Hannah L, [facility] AssociationRPH      . tamsulosin [facility](FLOMAX) capsule 0.4 mg  0.4 mg Oral Daily Sudini, Wardell HeathSrikar, MD   0.4 mg at 04/12/18 0900  . traZODone (DESYREL) tablet 50 mg  50 mg Oral QHS Milagros LollSudini, Srikar, MD   50 mg at 04/11/18 2333     Discharge Medications: Please see discharge summary for a list of discharge medications.  Relevant Imaging Results:  Relevant Lab Results:   Additional Information (SSN: [id number])  Breylen Agyeman, Darleen CrockerBailey M, LCSW

## 2018-04-12 NOTE — Progress Notes (Signed)
Pharmacy Electrolyte Monitoring Consult:  Pharmacy consulted to assist in monitoring and replacing electrolytes in this 83 y.o. male admitted on 04/10/2018 with Altered Mental Status   Labs:  Sodium (mmol/L)  Date Value  04/12/2018 134 (L)   Potassium (mmol/L)  Date Value  04/12/2018 2.9 (L)   Magnesium (mg/dL)  Date Value  16/10/960406/24/2019 1.6 (L)   Calcium (mg/dL)  Date Value  54/09/811906/24/2019 8.6 (L)   Albumin (g/dL)  Date Value  14/78/295606/22/2019 3.7    Assessment/Plan: MD has ordered KCl 40 mEq PO x1 Will order another 40 mEq PO x1 and mag sulfate 2g IV x1 Recheck in AM  Crist FatWang, Ivalee Strauser L 04/12/2018 3:26 PM

## 2018-04-12 NOTE — Evaluation (Signed)
Physical Therapy Evaluation Patient Details Name: Trevor Porter MRN: [id number] DOB: [birth year] Today's Date: 04/12/2018   History of Present Illness  83 y.o. male with PMHx including atrial fibrillation, dementia, prior UTIs, recurrent falls presents to the hospital due to concern for slurred speech which has improved. CT scan of the head was negative. MRI of the brain unable to be done as can't lay flat and has cervical fusion.  Pt's anticoagulation was stopped in the past due to recurrent falls.  Clinical Impression  Upon evaluation, patient alert and interactive with therapist; pleasant and cooperative throughout.  Oriented to self, general location, but requires increased time for task processing and initiation; poor awareness of deficits and safety needs.  Bilat UE/LE strength and ROM grossly symmetrical and WFL for basic transfers and mobility.  Currently requires min/mod assist for bed mobility; min assist with RW for sit/stand, basic transfers and gait (120').  Partially reciprocal stepping pattern with decreased flexion of L hip/knee throughout gait cycle, but no overt buckling or LOB.  Does require extensive cuing (hand-over-hand guidance) for direction and walker management at times. Would benefit from skilled PT to address above deficits and promote optimal return to PLOF; recommend transition to STR upon discharge from acute hospitalization.     Follow Up Recommendations SNF    Equipment Recommendations       Recommendations for Other Services       Precautions / Restrictions Precautions Precautions: Fall Precaution Comments: contact iso Restrictions Weight Bearing Restrictions: No      Mobility  Bed Mobility Overal bed mobility: Needs Assistance Bed Mobility: Supine to Sit     Supine to sit: Min assist;Mod assist     General bed mobility comments: hand over hand to initiate and guide movement transition  Transfers Overall transfer level: Needs  assistance Equipment used: Rolling walker (2 wheeled) Transfers: Sit to/from [employer] Sit to Stand: Min assist Stand pivot transfers: Min assist       General transfer comment: step by step cuing for task comprehension, mod assist for walker position/management  Ambulation/Gait Ambulation/Gait assistance: Min assist Gait Distance (Feet): 120 Feet Assistive device: Rolling walker (2 wheeled)       General Gait Details: partially reciprocal stepping pattern; forward flexed posture wtih downward gaze.  Limited L knee flexion, maintains in slightly guarded position but denies pain.  Limited awareness of fatigue; cuing to initiate seated rest breaks with signs of fatigue (slowed gait perforamnce, increased distraction)  Stairs            Wheelchair Mobility    Modified Rankin (Stroke Patients Only)       Balance Overall balance assessment: Needs assistance Sitting-balance support: No upper extremity supported;Feet supported Sitting balance-Leahy Scale: Fair     Standing balance support: Bilateral upper extremity supported Standing balance-Leahy Scale: Fair                               Pertinent Vitals/Pain Pain Assessment: No/denies pain    Home Living Family/patient expects to be discharged to:: Skilled nursing facility                 Additional Comments: Per chart, patient resident of [facility]    Prior Function           Comments: Per chart, WC level as primary mobility; assist from staff as needed for basic transfers and ADLs. Intermittent participation with restorative program at LTC.  Patient indicates transfers performed with RW     Hand Dominance   Dominant Hand: Right    Extremity/Trunk Assessment   Upper Extremity Assessment Upper Extremity Assessment: Overall WFL for tasks assessed    Lower Extremity Assessment Lower Extremity Assessment: Overall WFL for tasks assessed(grossly at least 4-/5)        Communication   Communication: No difficulties  Cognition Arousal/Alertness: Awake/alert Behavior During Therapy: WFL for tasks assessed/performed Overall Cognitive Status: History of cognitive impairments - at baseline                                 General Comments: Pt oriented to self, place, and generally to situation. Difficulty with PLOF questions with contradictory responses. Pleasant, able to follow simple commands with cues and time. Additional time to initiate and problem solve.      General Comments      Exercises     Assessment/Plan    PT Assessment Patient needs continued PT services  PT Problem List Decreased strength;Decreased range of motion;Decreased activity tolerance;Decreased balance;Decreased mobility;Decreased cognition;Decreased safety awareness;Decreased knowledge of precautions;Decreased knowledge of use of DME       PT Treatment Interventions DME instruction;Functional mobility training;Therapeutic activities;Gait training;Therapeutic exercise;Balance training;Cognitive remediation;Patient/family education    PT Goals (Current goals can be found in the Care Plan section)  Acute Rehab PT Goals Patient Stated Goal: "to get better" PT Goal Formulation: With patient Time For Goal Achievement: 04/26/18 Potential to Achieve Goals: Fair    Frequency Min 2X/week   Barriers to discharge Decreased caregiver support      Co-evaluation               AM-PAC PT "6 Clicks" Daily Activity  Outcome Measure Difficulty turning over in bed (including adjusting bedclothes, sheets and blankets)?: Unable Difficulty moving from lying on back to sitting on the side of the bed? : Unable Difficulty sitting down on and standing up from a chair with arms (e.g., wheelchair, bedside commode, etc,.)?: Unable Help needed moving to and from a bed to chair (including a wheelchair)?: A Little Help needed walking in hospital room?: A Little Help needed  climbing 3-5 steps with a railing? : A Lot 6 Click Score: 11    End of Session Equipment Utilized During Treatment: Gait belt Activity Tolerance: Patient tolerated treatment well Patient left: with call bell/phone within reach;with chair alarm set;in chair;with nursing/sitter in room Nurse Communication: Mobility status PT Visit Diagnosis: Muscle weakness (generalized) (M62.81);Difficulty in walking, not elsewhere classified (R26.2)    Time: 1610-96041520-1555 PT Time Calculation (min) (ACUTE ONLY): 35 min   Charges:   PT Evaluation $PT Eval Moderate Complexity: 1 Mod PT Treatments $Gait Training: 8-22 mins   PT G Codes:        Devante Capano H. Manson PasseyBrown, PT, DPT, NCS 04/12/18, 11:23 PM [phone]

## 2018-04-12 NOTE — Progress Notes (Signed)
CRITICAL VALUE ALERT  Critical Value:  K+ 2.9  Date & Time Notied:  06/24/191520  Provider Notified: Dr. Sherryll BurgerShah  Orders Received/Actions taken: 40 mEq potassium chloride.

## 2018-04-12 NOTE — Evaluation (Signed)
Clinical/Bedside Swallow Evaluation Patient Details  Name: Trevor Porter: [id number] Date of Birth: December 12, 1934  Today's Date: 04/12/2018 Time: SLP Start Time (ACUTE ONLY): 1252 SLP Stop Time (ACUTE ONLY): 1325 SLP Time Calculation (min) (ACUTE ONLY): 33 min  Past Medical History:  Past Medical History:  Diagnosis Date  . Dementia   . HTN (hypertension)   . Insomnia   . Paroxysmal atrial fibrillation [facility](HCC)    Past Surgical History:  Past Surgical History:  Procedure Laterality Date  . INTRAMEDULLARY (IM) NAIL INTERTROCHANTERIC Right 01/06/2018   Procedure: INTRAMEDULLARY (IM) NAIL INTERTROCHANTRIC;  Surgeon: Signa KellPatel, Sunny, MD;  Location: [facility] ORS;  Service: Orthopedics;  Laterality: Right;  . NO PAST SURGERIES     HPI: Per admitting H and P:    Trevor Porter  is a 83 y.o. male with a known history of atrial fibrillation, dementia, prior UTIs presents to the hospital due to concern for slurred speech earlier today. At this time patient is pleasantly confused. No focal weakness. CT scan of the head was negative. MRI of the brain was ordered in the emergency room but could not be done due to neck fusion surgery and patient unable to extend his neck. CT head and neck were checked and showed nothing acute. Patient is being admitted for workup of TIA.   Assessment / Plan / Recommendation Clinical Impression  Pt presents with functional swallowing abilities at bedside with no overt s/s of aspiration. Oral mech exam revealed structures to be functioning adequately with no apparent weakness. It should be noted that Pt had a congested cough prior to any PO's given. Oral transit delay with solids although Pt was able to clear oral cavity adequately. Vocal quality remained clear, laryngeal elevation appeared adequate. Rec dys 3 diet with thin liquids. ST to f/u with toleration of diet. SLP Visit Diagnosis: Dysphagia, oropharyngeal phase (R13.12)    Aspiration Risk  Mild aspiration risk    Diet  Recommendation Dysphagia 3 (Mech soft);Thin liquid   Compensations: Small sips/bites Postural Changes: Seated upright at 90 degrees    Other  Recommendations Oral Care Recommendations: Oral care BID   Follow up Recommendations        Frequency and Duration min 2x/week  1 week       Prognosis Prognosis for Safe Diet Advancement: Good      Swallow Study   General Type of Study: Bedside Swallow Evaluation Diet Prior to this Study: Dysphagia 3 (soft);Thin liquids Temperature Spikes Noted: No Respiratory Status: Room air Behavior/Cognition: Alert;Cooperative;Pleasant mood;Confused Oral Cavity Assessment: Within Functional Limits Oral Cavity - Dentition: Other (Comment);Missing dentition(Upper implants, no lower dentition) Vision: Functional for self-feeding Self-Feeding Abilities: Needs set up Patient Positioning: Upright in bed Baseline Vocal Quality: Normal Volitional Cough: Strong    Oral/Motor/Sensory Function Overall Oral Motor/Sensory Function: Within functional limits   Ice Chips Ice chips: Within functional limits Presentation: Spoon   Thin Liquid Thin Liquid: Within functional limits    Nectar Thick Nectar Thick Liquid: Not tested   Honey Thick     Puree Puree: Within functional limits Presentation: Spoon   Solid   GO   Solid: Impaired Presentation: Self Fed Oral Phase Functional Implications: Prolonged oral transit        Eather ColasJennifer A Nihal Doan 04/12/2018,1:53 PM

## 2018-04-13 DIAGNOSIS — Z7189 Other specified counseling: Secondary | ICD-10-CM

## 2018-04-13 DIAGNOSIS — F0391 Unspecified dementia with behavioral disturbance: Secondary | ICD-10-CM

## 2018-04-13 DIAGNOSIS — Z515 Encounter for palliative care: Secondary | ICD-10-CM

## 2018-04-13 DIAGNOSIS — F03918 Unspecified dementia, unspecified severity, with other behavioral disturbance: Secondary | ICD-10-CM

## 2018-04-13 DIAGNOSIS — R4182 Altered mental status, unspecified: Secondary | ICD-10-CM

## 2018-04-13 LAB — MAGNESIUM: Magnesium: 2 mg/dL (ref 1.7–2.4)

## 2018-04-13 LAB — BASIC METABOLIC PANEL
Anion gap: 8 (ref 5–15)
BUN: 12 mg/dL (ref 8–23)
CHLORIDE: 105 mmol/L (ref 98–111)
CO2: 23 mmol/L (ref 22–32)
Calcium: 8.8 mg/dL — ABNORMAL LOW (ref 8.9–10.3)
Creatinine, Ser: 0.63 mg/dL (ref 0.61–1.24)
GFR calc non Af Amer: >60 mL/min (ref >60)
Glucose, Bld: 98 mg/dL (ref 70–99)
POTASSIUM: 3.7 mmol/L (ref 3.5–5.1)
Sodium: 136 mmol/L (ref 135–145)

## 2018-04-13 LAB — CBC
HEMATOCRIT: 32.4 % — AB (ref 40.0–52.0)
Hemoglobin: 10.5 g/dL — ABNORMAL LOW (ref 13.0–18.0)
MCH: 25.4 pg — AB (ref 26.0–34.0)
MCHC: 32.4 g/dL (ref 32.0–36.0)
MCV: 78.3 fL — AB (ref 80.0–100.0)
Platelets: 155 10*3/uL (ref 150–440)
RBC: 4.13 MIL/uL — AB (ref 4.40–5.90)
RDW: 17.1 % — ABNORMAL HIGH (ref 11.5–14.5)
WBC: 7.7 10*3/uL (ref 3.8–10.6)

## 2018-04-13 NOTE — Progress Notes (Signed)
Pharmacy Electrolyte Monitoring Consult:  Pharmacy consulted to assist in monitoring and replacing electrolytes in this 83 y.o. male admitted on 04/10/2018 with Altered Mental Status   Labs:  Sodium (mmol/L)  Date Value  04/13/2018 136   Potassium (mmol/L)  Date Value  04/13/2018 3.7   Magnesium (mg/dL)  Date Value  16/10/960406/25/2019 2.0   Calcium (mg/dL)  Date Value  54/09/811906/25/2019 8.8 (L)   Albumin (g/dL)  Date Value  14/78/295606/22/2019 3.7    Assessment/Plan: Electrolytes are WNL. Will f/u AM labs.   Luisa HartChristy, Pegeen Stiger D 04/13/2018 10:05 AM

## 2018-04-13 NOTE — Consult Note (Signed)
Consultation Note Date: 04/13/2018   Patient Name: Trevor Porter  DOB: 08-12-1935  MRN: [id number]  Age / Sex: 83 y.o., male  PCP: Keane Police, MD Referring Physician: Delfino Lovett, MD  Reason for Consultation: Establishing goals of care and Hospice Evaluation  HPI/Patient Profile: 83 y.o. male  with past medical history of a fib, UTIs, hip fracture, HTN admitted on 04/10/2018 with slurred speech. Speech has now improved. CT scan of head was negative and could not complete MRI d/t cervical fusion. Pt was on anticoagulation for afib but was stopped d/t recurrent falls. PMT consulted for GOC.  Clinical Assessment and Goals of Care: I have reviewed medical records including EPIC notes, labs and imaging, received report from RN, assessed the patient and then spoke with patient's daughter, Corrie Dandy,  to discuss diagnosis prognosis, GOC, EOL wishes, disposition and options.  I introduced Palliative Medicine as specialized medical care for people living with serious illness. It focuses on providing relief from the symptoms and stress of a serious illness. The goal is to improve quality of life for both the patient and the family.  We discussed a brief life review of the patient. 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 Patient was married -spouse passed away in Mar 07, 1983.  As far as functional and nutritional status, daughter tells me of a steady decline. Patient is non-ambulatory. He is incontinent of B&B and dependent in all ADLs. He has had multiple UTIs and episodes of diarrhea that she feels have further weakened him. She tells me there are times he does not recognize her. Tells me his appetite is decreasing and he sleeps most of the time. He does speak to her but words are minimal.   We discussed his current illness and what it means in the larger context of his on-going co-morbidities.  Natural disease trajectory and expectations at EOL were  discussed. We discussed progressive nature of his dementia and discussed that disease process is complicated by need for anticoagulation yet a need to hold it d/t recurrent falls.   I attempted to elicit values and goals of care important to the patient. She would like to maintain as much dignity as possible for the patient and feels this would be important to him.   The difference between aggressive medical intervention and comfort care was considered in light of the patient's goals of care. She is interested in focusing on his comfort and quality of life.   Advanced directives, concepts specific to code status, artifical feeding and hydration, and rehospitalization were considered and discussed. The daughter is HCPOA. Confirmed DNR. She would like to focus on comfort, avoid any further hospitalizations, and not artificially prolong his life.   Hospice and Palliative Care services outpatient were explained and offered. She would like patient to be discharged to SNF with hospice - we discussed hospice philosophy of care.   Questions and concerns were addressed. The family was encouraged to call with questions or concerns.   Primary Decision Maker HCPOA - daughter, Corrie Dandy    SUMMARY OF RECOMMENDATIONS   Discharge to SNF with hospice -  Code Status/Advance Care Planning:  DNR  Psycho-social/Spiritual:   Desire for further Chaplaincy support:no  Additional Recommendations: Education on Hospice  Prognosis:   < 6 months  pt with advanced dementia, recurrent falls, a fib and anticoagulation on hold d/t falls, multiple hospital admissions  Discharge Planning: Skilled Nursing Facility with Hospice      Primary Diagnoses: Present on Admission: . TIA (transient ischemic  attack) . Acute metabolic encephalopathy   I have reviewed the medical record, interviewed the patient and family, and examined the patient. The following aspects are pertinent.  Past Medical History:  Diagnosis Date   . Dementia   . HTN (hypertension)   . Insomnia   . Paroxysmal atrial fibrillation (HCC)    Social History   Socioeconomic History  . Marital status: Married    Spouse name: Not on file  . Number of children: Not on file  . Years of education: Not on file  . Highest education level: Bachelor's degree (e.g., BA, AB, BS)  Occupational History  . Not on file  Social Needs  . Financial resource strain: Patient refused  . Food insecurity:    Worry: Patient refused    Inability: Patient refused  . Transportation needs:    Medical: Patient refused    Non-medical: Patient refused  Tobacco Use  . Smoking status: Never Smoker  . Smokeless tobacco: Never Used  Substance and Sexual Activity  . Alcohol use: No    Frequency: Never  . Drug use: No  . Sexual activity: Not Currently  Lifestyle  . Physical activity:    Days per week: Patient refused    Minutes per session: Patient refused  . Stress: Patient refused  Relationships  . Social connections:    Talks on phone: Patient refused    Gets together: Patient refused    Attends religious service: Patient refused    Active member of club or organization: Patient refused    Attends meetings of clubs or organizations: Patient refused    Relationship status: Patient refused  Other Topics Concern  . Not on file  Social History Narrative   Currently residing at [facility]   Family History  Problem Relation Age of Onset  . Hypertension Other    Scheduled Meds: .  stroke: mapping our early stages of recovery book   Does not apply Once  . amLODipine  2.5 mg Oral Daily  . aspirin  300 mg Rectal Daily   Or  . aspirin  325 mg Oral Daily  . atorvastatin  40 mg Oral q1800  . carvedilol  25 mg Oral BID WC  . enoxaparin (LOVENOX) injection  40 mg Subcutaneous Q24H  . Melatonin  2.5 mg Oral QHS  . tamsulosin  0.4 mg Oral Daily  . traZODone  50 mg Oral QHS   Continuous Infusions: . sodium chloride 50 mL/hr at 04/12/18 1710    PRN Meds:.acetaminophen **OR** acetaminophen (TYLENOL) oral liquid 160 mg/5 mL **OR** acetaminophen, guaiFENesin-dextromethorphan, hydrALAZINE, oxyCODONE Allergies  Allergen Reactions  . Penicillins Other (See Comments)    Patient passed out Has patient had a PCN reaction causing immediate rash, facial/tongue/throat swelling, SOB or lightheadedness with hypotension:YES Has patient had a PCN reaction causing severe rash involving mucus membranes or skin necrosis: Unknown Has patient had a PCN reaction that required hospitalization: Unknown Has patient had a PCN reaction occurring within the last 10 years: No If all of the above answers are "NO", then may proceed with Cephalosporin use.  . Lactose Intolerance (Gi)   . Tramadol Other (See Comments)    Unknown   Review of Systems  Unable to perform ROS: Dementia    Physical Exam  Constitutional: He appears well-developed. He is cooperative. No distress.  HENT:  Head: Normocephalic and atraumatic.  Cardiovascular: Normal rate. An irregular rhythm present.  Pulmonary/Chest: Effort normal and breath sounds normal.  Abdominal: Soft. Bowel sounds are normal.  Musculoskeletal:       Right lower leg: He exhibits no edema.       Left lower leg: He exhibits no edema.  Neurological: He is alert. He is disoriented.  Skin: Skin is warm and dry.  Psychiatric: Cognition and memory are impaired.    Vital Signs: BP 138/81 (BP Location: Right Arm)   Pulse 68   Temp 98.6 F (37 C) (Oral)   Resp (!) 22   Ht 5\' 11"  (1.803 m)   Wt 63.5 kg (140 lb)   SpO2 95%   BMI 19.53 kg/m  Pain Scale: 0-10   Pain Score: Asleep   SpO2: SpO2: 95 % O2 Device:SpO2: 95 % O2 Flow Rate: .   IO: Intake/output summary:   Intake/Output Summary (Last 24 hours) at 04/13/2018 1624 Last data filed at 04/13/2018 0700 Gross per 24 hour  Intake 150 ml  Output 0 ml  Net 150 ml    LBM: Last BM Date: 04/11/18 Baseline Weight: Weight: 63.5 kg (140 lb) Most  recent weight: Weight: 63.5 kg (140 lb)     Palliative Assessment/Data: PPS 30%     Time Total: 50 minutes Greater than 50%  of this time was spent counseling and coordinating care related to the above assessment and plan.  Gerlean RenShae Lee Julizza Sassone, DNP, AGNP-C Palliative Medicine Team [phone] Pager: [phone]

## 2018-04-13 NOTE — Progress Notes (Signed)
Clinical Child psychotherapistocial Worker (CSW) received consult to [facility]. CSW contacted patient's daughter Trevor Porter and provided hospice agency choice. She chose [facility]. [name] hospice liaison is aware of above. Trevor Porter [facility] is aware of above.   [facility] Hughes IncorporatedBailey Trevor Pogue, LCSW [phone]

## 2018-04-13 NOTE — Progress Notes (Addendum)
  Speech Language Pathology Treatment: Dysphagia  Patient Details Name: Trevor GlassingJohn Porter MRN: [id number] DOB: [birth year] Today's Date: 04/13/2018 Time: [id number] SLP Time Calculation (min) (ACUTE ONLY): 35 min  Assessment / Plan / Recommendation Clinical Impression  Pt seen for ongoing toleration of oral diet; education on aspiration precautions; recommendations for discharge pending. Pt sitting in chair eating his breakfast meal; feeding self given setup assist. Pt consumed trials of thin liquids via cup/straw then soft solids w/ no immediate, overt s/s of aspiration noted. Noted clear vocal quality and no decline in respiratory status during/post trials. Oral phase appeared min lengthy w/ increased textured foods - increased mastication effort/time w/ chopped sausage/eggs. Pt was able to clear orally given time and when alternating w/ another moist food such as pudding/applesauce. Pt also consumed sips of Ensure to aid oral clearing w/ food trials - a supplement DRINK could be beneficial to aid pt's overall intake/nutritional status d/t the lengthy oral phase time.  Recommend a Dysphagia level 3 (mech soft foods) w/ gravy added to moisten; thin liquids. Recommend general aspiration precautions AND PILLS IN APPLESAUCE WHOLE as tolerates for Safer Swallowing. ST services can be available for further education if needed. Pt agreed. NSG updated, agreed.      HPI HPI: Pt  is a 83 y.o. male with a known history of atrial fibrillation, Dementia, prior UTIs, R hip fx, encephalopathy, HTN presents to the hospital due to concern for slurred speech earlier today. At this time patient is pleasantly confused. No focal weakness. CT scan of the head was negative. MRI of the brain was ordered in the emergency room but could not be done due to neck fusion surgery and patient unable to extend his neck. CT head and neck were checked and showed nothing acute. Pt is admitted; on a Dysphagia level 3 w/ thin liquids diet  tolerating adequately per NSG report. Pt is swallowing PIlls in PUREE w/ NSG. Pt is a long term resident of a SNF.  OT and PT have evaluated w/ recommendation to return to SNF; needs support w/ all ADLs.      SLP Plan  Continue with current plan of care(d/c education given)       Recommendations  Diet recommendations: Dysphagia 3 (mechanical soft);Thin liquid Liquids provided via: Cup;Straw(no large straws) Medication Administration: Whole meds with puree(for safer swallowing) Supervision: Patient able to self feed;Intermittent supervision to cue for compensatory strategies(setup support) Compensations: Minimize environmental distractions;Slow rate;Small sips/bites;Lingual sweep for clearance of pocketing;Multiple dry swallows after each bite/sip;Follow solids with liquid Postural Changes and/or Swallow Maneuvers: Seated upright 90 degrees;Upright 30-60 min after meal                General recommendations: (Dietician f/u for support) Oral Care Recommendations: Oral care BID;Patient independent with oral care;Staff/trained caregiver to provide oral care Follow up Recommendations: None SLP Visit Diagnosis: Dysphagia, oropharyngeal phase (R13.12) Plan: Continue with current plan of care(d/c education given)       GO                Trevor Porter Gail, MS, CCC-SLP Trinten Boudoin 04/13/2018, 10:52 AM

## 2018-04-13 NOTE — Plan of Care (Signed)
  Problem: Pain Managment: Goal: General experience of comfort will improve Outcome: Progressing   Problem: Safety: Goal: Ability to remain free from injury will improve Outcome: Progressing   Problem: Education: Goal: Knowledge of patient specific risk factors addressed and post discharge goals established will improve Outcome: Progressing

## 2018-04-13 NOTE — Progress Notes (Signed)
[employer] [facility] [place] [facility]   PATIENT NAME: Trevor GlassingJohn Porter    MR#:  [id number]  DATE OF BIRTH:  1935/01/05  SUBJECTIVE:  CHIEF COMPLAINT:   Chief Complaint  Patient presents with  . Altered Mental Status  much awake and alert today, waiting for palliative care eval REVIEW OF SYSTEMS:    Review of Systems  Unable to perform ROS: Dementia  HENT: Congestion:      DRUG ALLERGIES:   Allergies  Allergen Reactions  . Penicillins Other (See Comments)    Patient passed out Has patient had a PCN reaction causing immediate rash, facial/tongue/throat swelling, SOB or lightheadedness with hypotension:YES Has patient had a PCN reaction causing severe rash involving mucus membranes or skin necrosis: Unknown Has patient had a PCN reaction that required hospitalization: Unknown Has patient had a PCN reaction occurring within the last 10 years: No If all of the above answers are "NO", then may proceed with Cephalosporin use.  . Lactose Intolerance (Gi)   . Tramadol Other (See Comments)    Unknown    VITALS:  Blood pressure 138/81, pulse 68, temperature 98.6 F (37 C), temperature source Oral, resp. rate (!) 22, height 5\' 11"  (1.803 m), weight 63.5 kg (140 lb), SpO2 95 %.  PHYSICAL EXAMINATION:   Physical Exam  GENERAL:  83 y.o.-year-old patient lying in the bed with no acute distress.  EYES: Pupils equal, round, reactive to light and accommodation. No scleral icterus. Extraocular muscles intact.  HEENT: Head atraumatic, normocephalic. Oropharynx and nasopharynx clear.  NECK:  Supple, no jugular venous distention. No thyroid enlargement, no tenderness.  LUNGS: Normal breath sounds bilaterally, no wheezing, rales, rhonchi. No use of accessory muscles of respiration.  CARDIOVASCULAR: S1, S2 normal. No murmurs, rubs, or gallops.  ABDOMEN: Soft, nontender, nondistended. Bowel sounds present. No organomegaly or mass.  EXTREMITIES: No cyanosis, clubbing or edema b/l.     NEUROLOGIC:Not following instructions PSYCHIATRIC: The patient is awake but pleasantly confused SKIN: No obvious rash, lesion, or ulcer.   LABORATORY PANEL:   CBC Recent Labs  Lab 04/13/18 0522  WBC 7.7  HGB 10.5*  HCT 32.4*  PLT 155   ------------------------------------------------------------------------------------------------------------------ Chemistries  Recent Labs  Lab 04/10/18 1138  04/13/18 0522  NA 138   < > 136  K 3.0*   < > 3.7  CL 105   < > 105  CO2 24   < > 23  GLUCOSE 111*   < > 98  BUN 13   < > 12  CREATININE 0.80   < > 0.63  CALCIUM 8.4*   < > 8.8*  MG  --    < > 2.0  AST 22  --   --   ALT 10*  --   --   ALKPHOS 109  --   --   BILITOT 1.1  --   --    < > = values in this interval not displayed.   ------------------------------------------------------------------------------------------------------------------  Cardiac Enzymes Recent Labs  Lab 04/10/18 1138  TROPONINI <0.03   ------------------------------------------------------------------------------------------------------------------  RADIOLOGY:  No results found.   ASSESSMENT AND PLAN:  83 y.o. male  known history of atrial fibrillation, dementia, prior UTIs presents to the hospital due to concern for slurred speech which has improved.   *TIA. -  AT baseline has dementia and oriented to self.   No focal weakness. CT scan of the head was negative. MRI of the brain unable to be done as can't lay flat and has  cervical fusion .  Patient has history of atrial fibrillation but his anticoagulation was stopped in the past due to recurrent falls. - Possibly at baseline - Frequent UtIs and hx of ESBL - Would hold of anticoagulation given above - no further imaging at this time per Neuro  * Severe hypokalemia - repleted and resolved  *Hypertension. Continue home medications. Also added IV hydralazine for better BP control  *Dementia. Monitor for inpatient delirium.  Paroxysmal  atrial fibrillation. Patient on aspirin. Anticoagulation stopped in the past due to recurrent falls.   He is a long term resident at [facility] - I talked with daughter over phone.  Plan is to go back to [facility] with Hospice if he qualifies - waiting for palliative care eval and discussion with daughter   All the records are reviewed and case discussed with Care Management/Social Workerr. Management plans discussed with the patient, nursing and they are in agreement.  CODE STATUS: DNR  DVT Prophylaxis: SCDs  TOTAL TIME TAKING CARE OF THIS PATIENT: 30 minutes.   POSSIBLE D/C IN 1 DAYS, DEPENDING ON CLINICAL CONDITION.  Delfino Lovett M.D on 04/13/2018 at 6:31 PM  Between 7am to 6pm - Pager - [phone]  After 6pm go to [email or web address] - password [email or web address] [facility]  [facility]  Office  [phone]  CC: Primary care physician; Keane Police, MD  Note: This dictation was prepared with Dragon dictation along with smaller phrase technology. Any transcriptional errors that result from this process are unintentional.

## 2018-04-13 NOTE — Progress Notes (Signed)
New referral for [facility], received from CSW [facility] following a Palliative Medicine consult. Patient information faxed to referral. Full note to follow. Dayna BarkerKaren Robertson RN, BSN, [facility], hospital Liaison [phone]

## 2018-04-14 LAB — BASIC METABOLIC PANEL
ANION GAP: 11 (ref 5–15)
BUN: 14 mg/dL (ref 8–23)
CHLORIDE: 105 mmol/L (ref 98–111)
CO2: 21 mmol/L — AB (ref 22–32)
Calcium: 8.6 mg/dL — ABNORMAL LOW (ref 8.9–10.3)
Creatinine, Ser: 0.65 mg/dL (ref 0.61–1.24)
GFR calc non Af Amer: >60 mL/min (ref >60)
GLUCOSE: 92 mg/dL (ref 70–99)
POTASSIUM: 3.4 mmol/L — AB (ref 3.5–5.1)
Sodium: 137 mmol/L (ref 135–145)

## 2018-04-14 MED ORDER — OXYCODONE HCL 5 MG PO TABS: 2.5000 mg | ORAL_TABLET | Freq: Every day | ORAL | 0 refills | Status: AC | PRN

## 2018-04-14 MED ORDER — POTASSIUM CHLORIDE CRYS ER 20 MEQ PO TBCR
20.0000 meq | EXTENDED_RELEASE_TABLET | Freq: Once | ORAL | Status: AC
  Administered 2018-04-14: 20 meq via ORAL
  Filled 2018-04-14: qty 1

## 2018-04-14 MED ORDER — ATORVASTATIN CALCIUM 40 MG PO TABS: 40.0000 mg | ORAL_TABLET | Freq: Every day | ORAL | Status: AC

## 2018-04-14 NOTE — Discharge Summary (Signed)
[employer] - [place] at [facility]   PATIENT NAME: Trevor Porter    MR#:  [id number]  DATE OF BIRTH:  07/12/1935  DATE OF ADMISSION:  04/10/2018   ADMITTING PHYSICIAN: Milagros Loll, MD  DATE OF DISCHARGE: 04/14/2018  PRIMARY CARE PHYSICIAN: Keane Police, MD   ADMISSION DIAGNOSIS:  TIA (transient ischemic attack) [G45.9] Altered mental status, unspecified altered mental status type [R41.82] DISCHARGE DIAGNOSIS:  Active Problems:   TIA (transient ischemic attack)   Acute metabolic encephalopathy   Altered mental status   Goals of care, counseling/discussion   Palliative care by specialist   Dementia with behavioral disturbance  SECONDARY DIAGNOSIS:   Past Medical History:  Diagnosis Date  . Dementia   . HTN (hypertension)   . Insomnia   . Paroxysmal atrial fibrillation [facility])    HOSPITAL COURSE:  83 y.o.maleknown history of atrial fibrillation, dementia, prior UTIs presents to the hospital due to concern for slurred speechwhich has improved.   *TIA. - AT baseline has dementia and oriented to self.No focal weakness. CT scan of the head was negative. MRI of the brain unable to be done as can't lay flat and has cervical fusion .Patient has history of atrial fibrillation but his anticoagulation was stopped in the past due to recurrent falls. - Possibly at baseline - Frequent UtIs and hx of ESBL - Would hold of anticoagulation given above - no further imaging at this time per Neuro  * Severe hypokalemia - repleted and resolved  *Hypertension. Continue home medications. Also added IV hydralazine for better BP control  *Dementia. Monitor for inpatient delirium.  Paroxysmal atrial fibrillation. Patient on aspirin. Anticoagulation stopped in the past due to recurrent falls. DISCHARGE CONDITIONS:  Stable, discharge to SNF with Hospice. CONSULTS OBTAINED:  Treatment Team:  Pauletta Browns, MD DRUG ALLERGIES:   Allergies    Allergen Reactions  . Penicillins Other (See Comments)    Patient passed out Has patient had a PCN reaction causing immediate rash, facial/tongue/throat swelling, SOB or lightheadedness with hypotension:YES Has patient had a PCN reaction causing severe rash involving mucus membranes or skin necrosis: Unknown Has patient had a PCN reaction that required hospitalization: Unknown Has patient had a PCN reaction occurring within the last 10 years: No If all of the above answers are "NO", then may proceed with Cephalosporin use.  . Lactose Intolerance (Gi)   . Tramadol Other (See Comments)    Unknown   DISCHARGE MEDICATIONS:   Allergies as of 04/14/2018      Reactions   Penicillins Other (See Comments)   Patient passed out Has patient had a PCN reaction causing immediate rash, facial/tongue/throat swelling, SOB or lightheadedness with hypotension:YES Has patient had a PCN reaction causing severe rash involving mucus membranes or skin necrosis: Unknown Has patient had a PCN reaction that required hospitalization: Unknown Has patient had a PCN reaction occurring within the last 10 years: No If all of the above answers are "NO", then may proceed with Cephalosporin use.   Lactose Intolerance (gi)    Tramadol Other (See Comments)   Unknown      Medication List    STOP taking these medications   traMADol 50 MG tablet Commonly known as:  ULTRAM     TAKE these medications   acetaminophen 325 MG tablet Commonly known as:  TYLENOL Take 975 mg by mouth every 8 (eight) hours.   aspirin 81 MG chewable tablet Chew 81 mg by mouth daily.   atorvastatin 40 MG tablet Commonly  known as:  LIPITOR Take 1 tablet (40 mg total) by mouth daily at 6 PM.   carvedilol 25 MG tablet Commonly known as:  COREG Take 1 tablet (25 mg total) by mouth 2 (two) times daily with a meal. What changed:  Another medication with the same name was removed. Continue taking this medication, and follow the directions  you see here.   cholecalciferol 1000 units tablet Commonly known as:  VITAMIN D Take 2,000 Units by mouth daily.   docusate sodium 100 MG capsule Commonly known as:  COLACE Take 1 capsule (100 mg total) by mouth 2 (two) times daily.   ibuprofen 200 MG tablet Commonly known as:  ADVIL,MOTRIN Take 200 mg by mouth 2 (two) times daily as needed for mild pain.   LACTAID PO Take 1 tablet by mouth 3 (three) times daily with meals.   lidocaine 5 % Commonly known as:  LIDODERM Place 1 patch onto the skin daily. Remove & Discard patch within 12 hours or as directed by MD   Melatonin 3 MG Tabs Take 3 mg by mouth at bedtime.   oxyCODONE 5 MG immediate release tablet Commonly known as:  Oxy IR/ROXICODONE Take 0.5 tablets (2.5 mg total) by mouth daily as needed for severe pain.   senna 8.6 MG Tabs tablet Commonly known as:  SENOKOT Take 1 tablet by mouth 2 (two) times daily as needed for mild constipation.   tamsulosin 0.4 MG Caps capsule Commonly known as:  FLOMAX Take 1 capsule (0.4 mg total) by mouth daily. What changed:  how much to take   traZODone 50 MG tablet Commonly known as:  DESYREL Take 50 mg by mouth at bedtime.   vitamin B-12 1000 MCG tablet Commonly known as:  CYANOCOBALAMIN Take 1,000 mcg by mouth daily.        DISCHARGE INSTRUCTIONS:  See AVS. If you experience worsening of your admission symptoms, develop shortness of breath, life threatening emergency, suicidal or homicidal thoughts you must seek medical attention immediately by calling 911 or calling your MD immediately  if symptoms less severe.  You Must read complete instructions/literature along with all the possible adverse reactions/side effects for all the Medicines you take and that have been prescribed to you. Take any new Medicines after you have completely understood and accpet all the possible adverse reactions/side effects.   Please note  You were cared for by a hospitalist during your  hospital stay. If you have any questions about your discharge medications or the care you received while you were in the hospital after you are discharged, you can call the unit and asked to speak with the hospitalist on call if the hospitalist that took care of you is not available. Once you are discharged, your primary care physician will handle any further medical issues. Please note that NO REFILLS for any discharge medications will be authorized once you are discharged, as it is imperative that you return to your primary care physician (or establish a relationship with a primary care physician if you do not have one) for your aftercare needs so that they can reassess your need for medications and monitor your lab values.    On the day of Discharge:  VITAL SIGNS:  Blood pressure 132/89, pulse 76, temperature 98.3 F (36.8 C), temperature source Oral, resp. rate (!) 22, height 5\' 11"  (1.803 m), weight 140 lb (63.5 kg), SpO2 97 %. PHYSICAL EXAMINATION:  GENERAL:  83 y.o.-year-old patient lying in the bed with no acute distress.  EYES: Pupils equal, round, reactive to light and accommodation. No scleral icterus. Extraocular muscles intact.  HEENT: Head atraumatic, normocephalic. Oropharynx and nasopharynx clear.  NECK:  Supple, no jugular venous distention. No thyroid enlargement, no tenderness.  LUNGS: Normal breath sounds bilaterally, no wheezing, rales,rhonchi or crepitation. No use of accessory muscles of respiration.  CARDIOVASCULAR: S1, S2 normal. No murmurs, rubs, or gallops.  ABDOMEN: Soft, non-tender, non-distended. Bowel sounds present. No organomegaly or mass.  EXTREMITIES: No pedal edema, cyanosis, or clubbing.  NEUROLOGIC: Cranial nerves II through XII are intact. Muscle strength 4/5 in all extremities. Sensation intact. Gait not checked.  PSYCHIATRIC: The patient is alert and oriented x 3.  SKIN: No obvious rash, lesion, or ulcer.  DATA REVIEW:   CBC Recent Labs  Lab  04/13/18 0522  WBC 7.7  HGB 10.5*  HCT 32.4*  PLT 155    Chemistries  Recent Labs  Lab 04/10/18 1138  04/13/18 0522 04/14/18 0432  NA 138   < > 136 137  K 3.0*   < > 3.7 3.4*  CL 105   < > 105 105  CO2 24   < > 23 21*  GLUCOSE 111*   < > 98 92  BUN 13   < > 12 14  CREATININE 0.80   < > 0.63 0.65  CALCIUM 8.4*   < > 8.8* 8.6*  MG  --    < > 2.0  --   AST 22  --   --   --   ALT 10*  --   --   --   ALKPHOS 109  --   --   --   BILITOT 1.1  --   --   --    < > = values in this interval not displayed.     Microbiology Results  Results for orders placed or performed during the hospital encounter of 04/10/18  MRSA PCR Screening     Status: None   Collection Time: 04/10/18  7:34 PM  Result Value Ref Range Status   MRSA by PCR NEGATIVE NEGATIVE Final    Comment:        The GeneXpert MRSA Assay (FDA approved for NASAL specimens only), is one component of a comprehensive MRSA colonization surveillance program. It is not intended to diagnose MRSA infection nor to guide or monitor treatment for MRSA infections. Performed at [facility], [address] [place], [place] [address]     RADIOLOGY:  No results found.   Management plans discussed with the patient, family and they are in agreement.  CODE STATUS: DNR   TOTAL TIME TAKING CARE OF THIS PATIENT: 33 minutes.    Shaune PollackQing Meric Joye M.D on 04/14/2018 at 11:15 AM  Between 7am to 6pm - Pager - [phone]  After 6pm go to [email or web address] - Social research officer, governmentpassword EPAS [email or web address]  [facility] [place] [facility]  Office  [phone]  CC: Primary care physician; Keane PoliceSlade-Hartman, Venezela, MD   Note: This dictation was prepared with Dragon dictation along with smaller phrase technology. Any transcriptional errors that result from this process are unintentional.

## 2018-04-14 NOTE — Progress Notes (Addendum)
Called facility for nursing report. All questions answered. Raynald BlendSteven M Salbador Fiveash  Called EMS for non-emergent patient transport to the facility at this time. Jari FavreSteven M [facility]

## 2018-04-14 NOTE — Progress Notes (Signed)
Visit made to new referral for [facility] following discharge. Patient seen sitting up in the recliner, dressed, ready for discharge. He was able to Wellsite geologistgreet writer, and recall his daughter's name, but not where she lives, does not remember where he lives, stated he was "going back to live with his daughter. He appears to be pleasantly confused. Water given as patient stated he was thirsty, he did well with swallowing, no cough noted and was able to hold his cup when given to him.  Per chart note review his appetite is fair and he is incontinent of bowel and bladder. [facility] for evaluation of slurred speech, CT did not reveal any acute changes and an MRI was unable to be performed. Patient has a history of Afib and but is not on anticoagulant d/t recurrent falls. Palliative Medicine was consulted for goals of care and spoke with patient's daughter via telephone yesterday. She has chosen to focus on comfort and to avoid any further hospitalizations with plan for patient to  [facility] with the support of hospice services. Writer spoke to patient's daughter Elberta LeatherwoodMary Fields ([phone]) to initiate education regarding hospice services, philosophy and team Approach to care with understanding voiced. Mary did request that hospice contact [facility] as [facility]. Information given to referral. [facility]. Patient information faxed to referral. Plan is for discharge today via EMS. Signed DNR in place in patient's discharge packet. Thank you for the opportunity to be involved in the care of this patient and his family.' Dayna BarkerKaren Robertson RN, BSN, [facility], hospital liaison [phone]

## 2018-04-14 NOTE — Progress Notes (Signed)
Patient is medically stable for D/[facility] and [facility]. [facility] is aware of above. Per Tiffany admissions coordinator [facility] patient can return today to room 319. RN will call report and arrange EMS for transport. Clinical Child psychotherapistocial Worker (CSW) sent [facility] via [employer]. CSW contacted patient's daughter Corrie DandyMary and made her aware of above. Please reconsult if future social work needs arise. CSW signing off.   Baker Hughes IncorporatedBailey Sayyid Harewood, LCSW [phone]

## 2018-04-14 NOTE — Progress Notes (Signed)
Pharmacy Electrolyte Monitoring Consult:  Pharmacy consulted to assist in monitoring and replacing electrolytes in this 83 y.o. male admitted on 04/10/2018 with Altered Mental Status   Labs:  Sodium (mmol/L)  Date Value  04/14/2018 137   Potassium (mmol/L)  Date Value  04/14/2018 3.4 (L)   Magnesium (mg/dL)  Date Value  16/10/960406/25/2019 2.0   Calcium (mg/dL)  Date Value  54/09/811906/26/2019 8.6 (L)   Albumin (g/dL)  Date Value  14/78/295606/22/2019 3.7    Assessment/Plan: KCl 20 meq po once. Patient has orders for d/c. Thank you for the consult.   Luisa HartChristy, Arel Tippen D 04/14/2018 12:13 PM

## 2018-07-25 IMAGING — DX DG CHEST 1V PORT
1 series · 1 of 1 positions shown · non-contrast
Comparison: None.

CLINICAL DATA: Altered mental status at daycare facility. Possible
sepsis.

EXAM:
PORTABLE CHEST 1 VIEW

[chest ap]
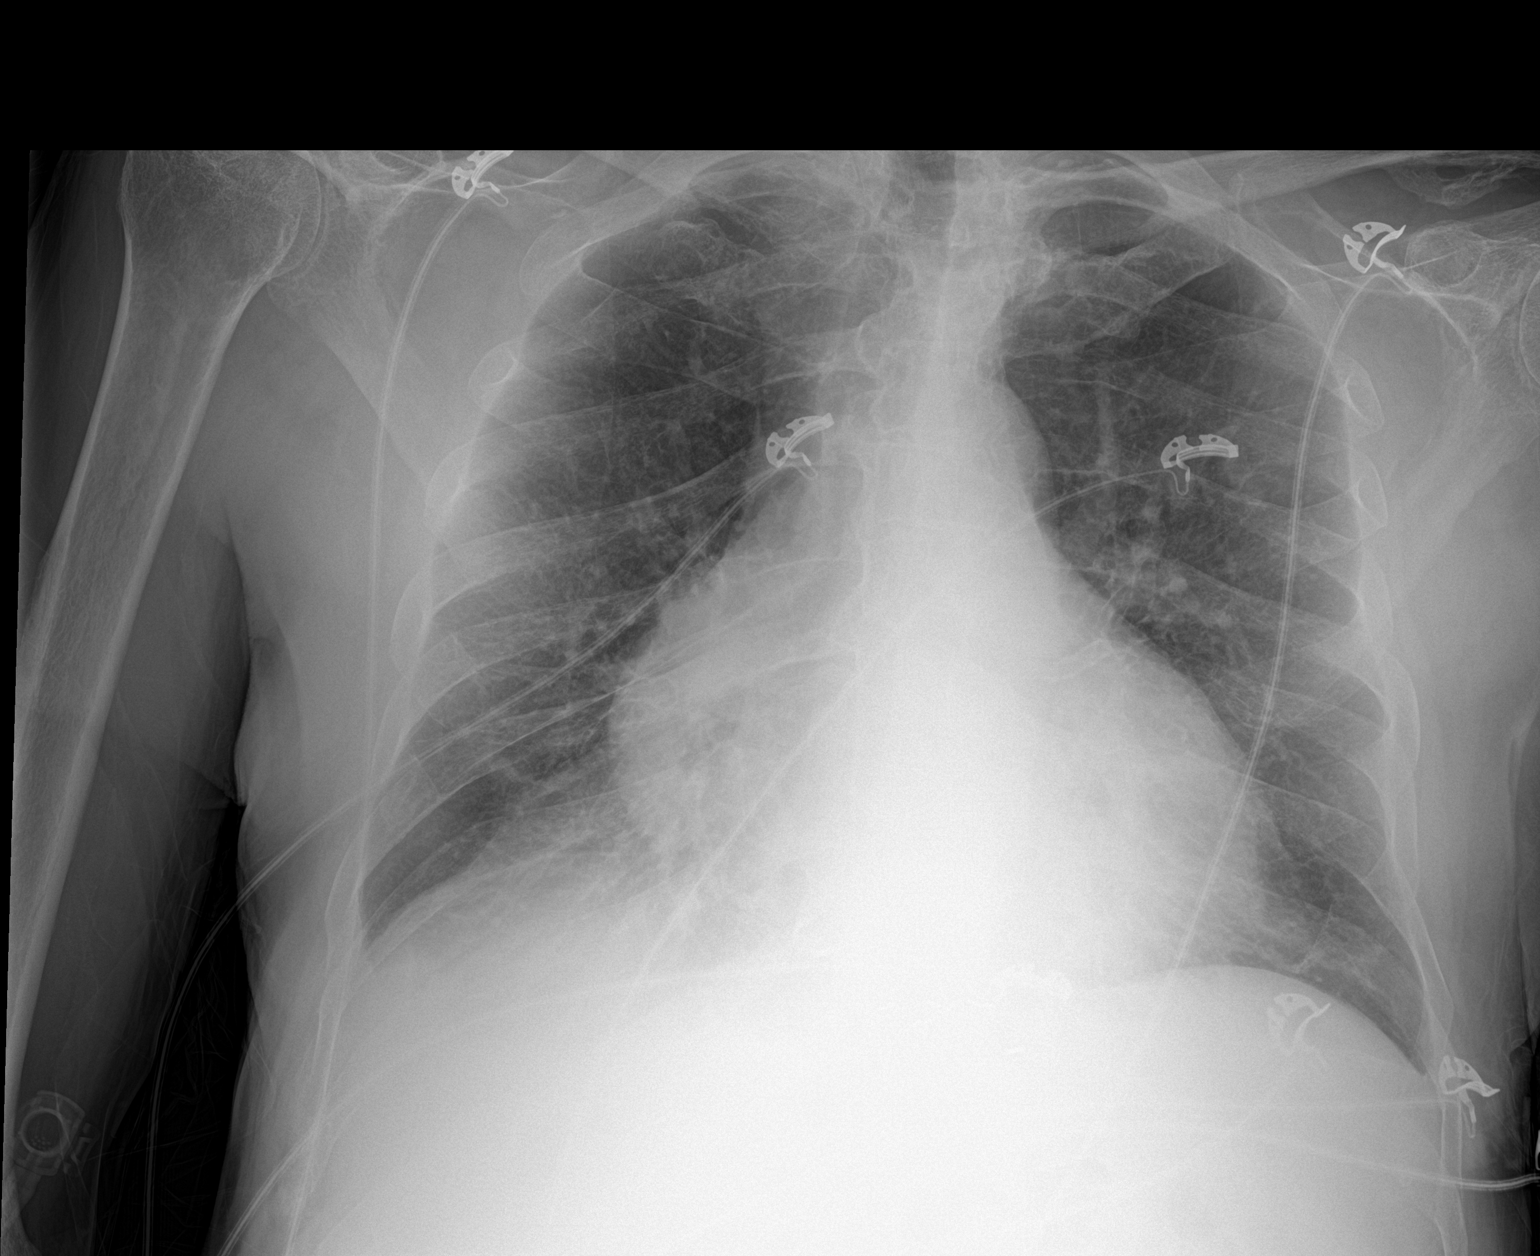

[1 of 1 positions shown; findings below may reference images not displayed]

FINDINGS: Marked cardiac enlargement. Ill-defined RIGHT base opacity could
represent infiltrate, or subsegmental atelectasis. No overt edema.
No effusion or pneumothorax. Osteopenia. RIGHT shoulder DJD.
IMPRESSION: Cardiomegaly. Ill-defined RIGHT base opacity, possible RIGHT base
infiltrate or subsegmental atelectasis. Consider two-view chest when
the patient is stable.

## 2018-07-25 IMAGING — CT CT HEAD W/O CM
3 series · 16 of 47 positions shown, 19 images · non-contrast
Comparison: None.

CLINICAL DATA: Confusion.

EXAM:
CT HEAD WITHOUT CONTRAST
TECHNIQUE: Contiguous axial images were obtained from the base of the skull
through the vertex without intravenous contrast.

[Series 3: head wo · axial · 0.40mm/px · z∈[-226,-101]mm · 10 of 30 slices shown, 13 images]
[im 3/30  brain]
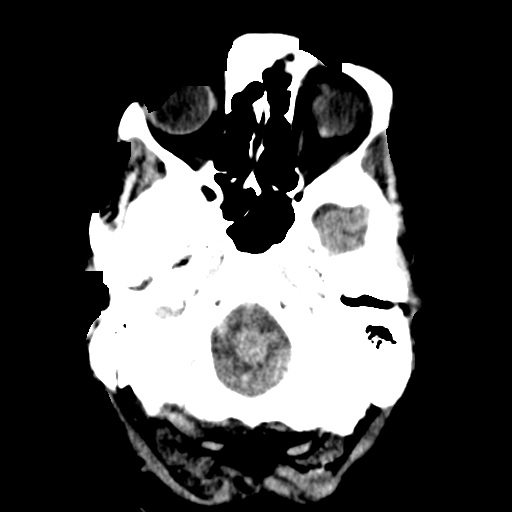
[im 3/30  bone]
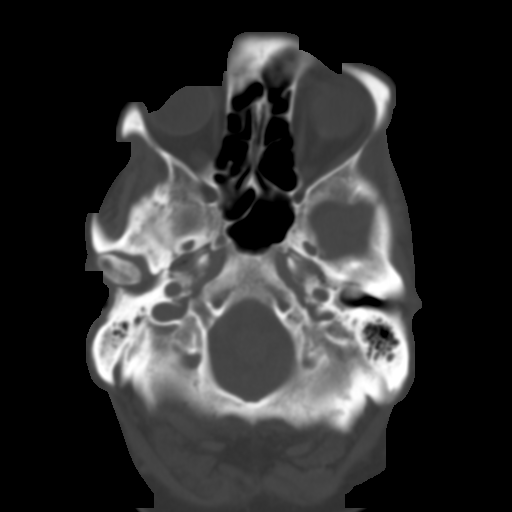
[im 6/30  brain]
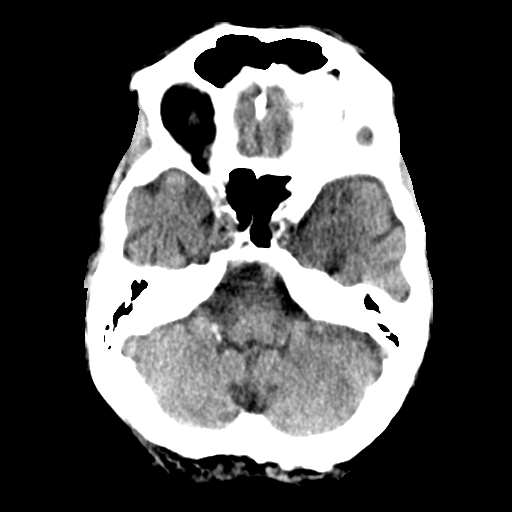
[im 9/30  brain]
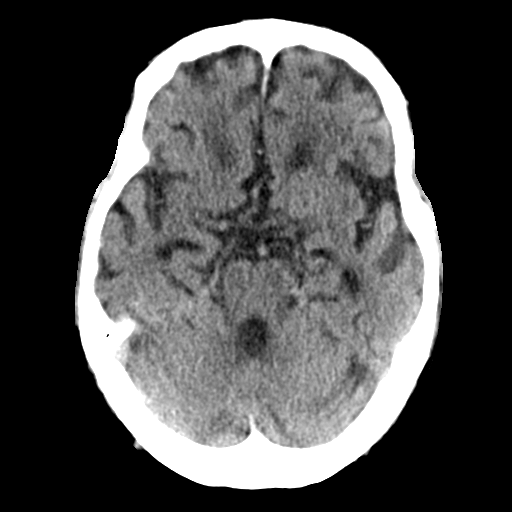
[im 11/30  brain]
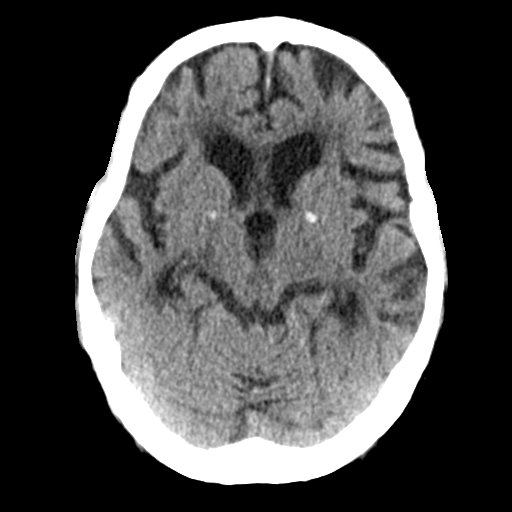
[im 14/30  brain]
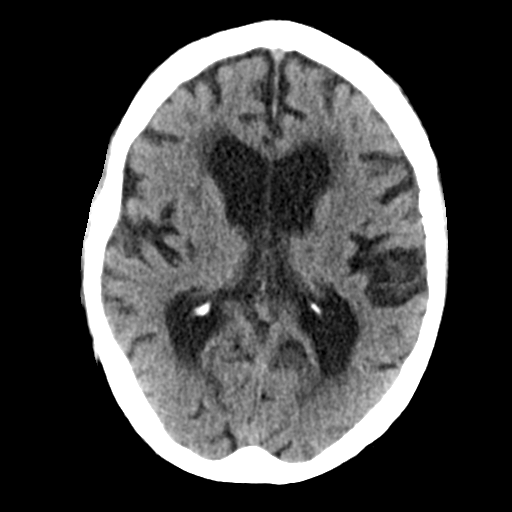
[im 14/30  bone]
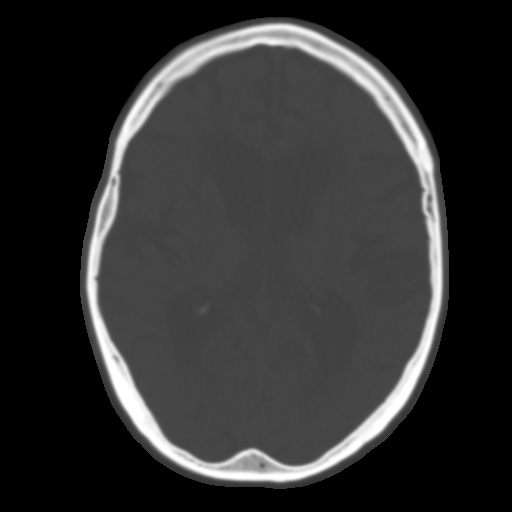
[im 17/30  brain]
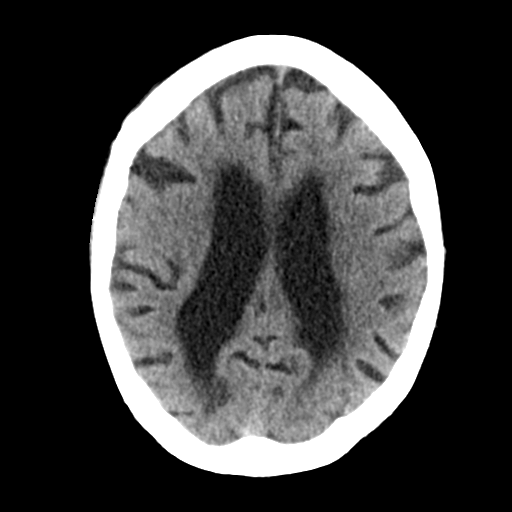
[im 20/30  brain]
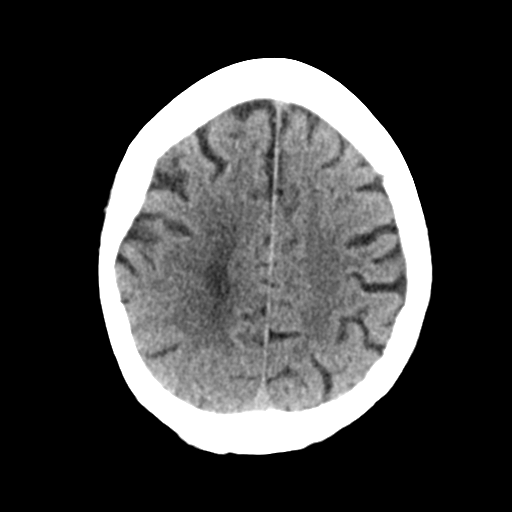
[im 23/30  brain]
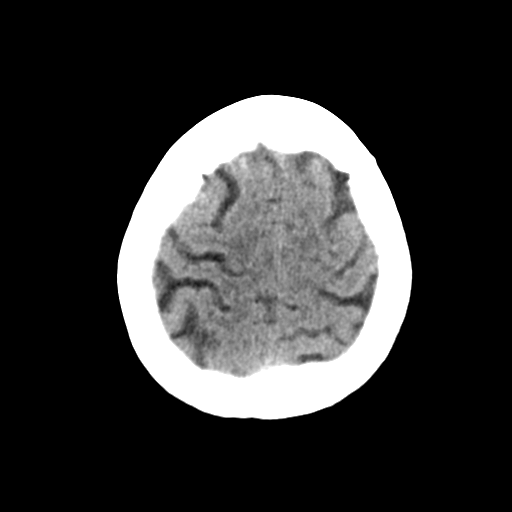
[im 25/30  brain]
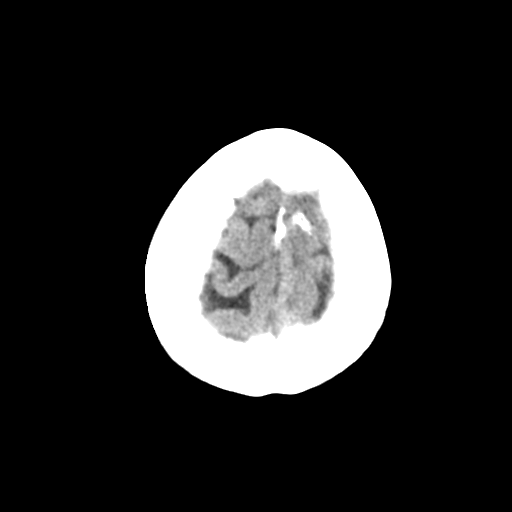
[im 25/30  bone]
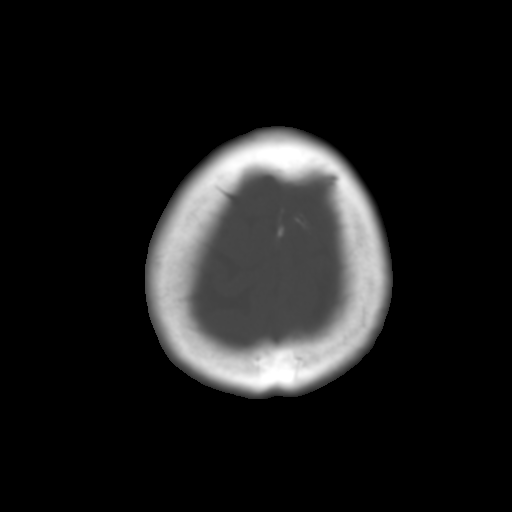
[im 28/30  brain]
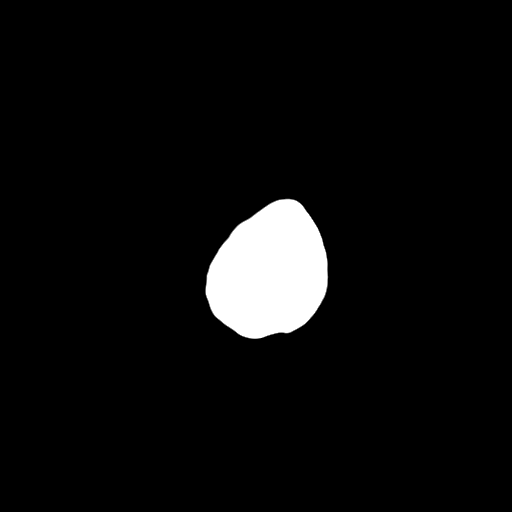

[Series 4: coronal soft tissue · coronal · 0.29mm/px · 3 of 64 slices shown]
[im 22/64  brain]
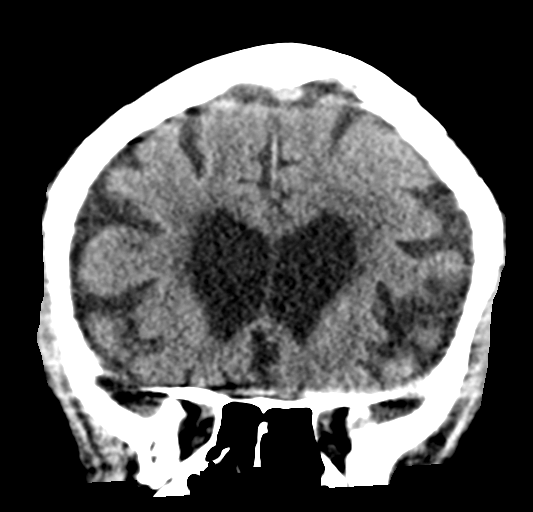
[im 29/64  brain]
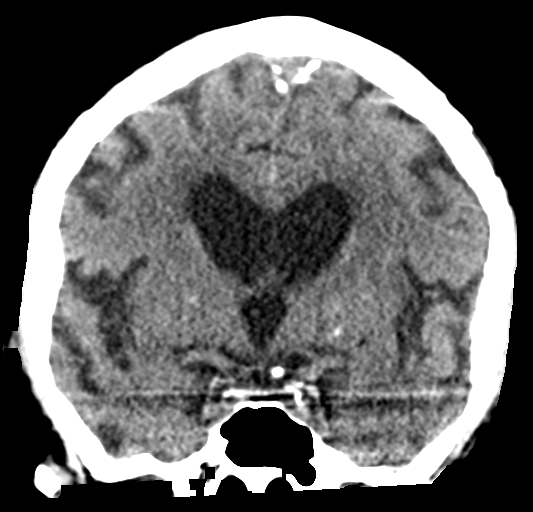
[im 36/64  brain]
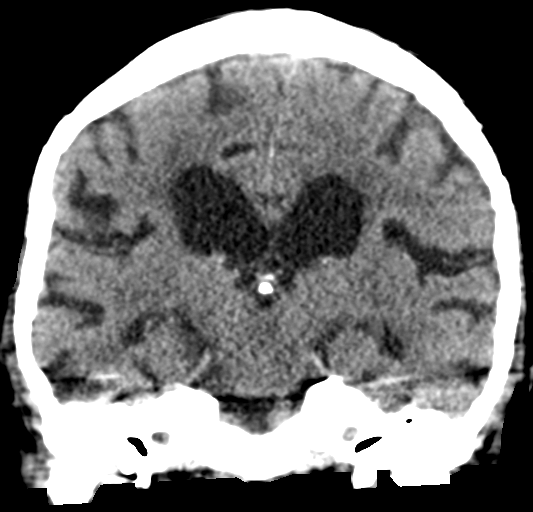

[Series 5: sagittal soft tissue · sagittal · 0.29mm/px · 3 of 52 slices shown]
[im 18/52  brain]
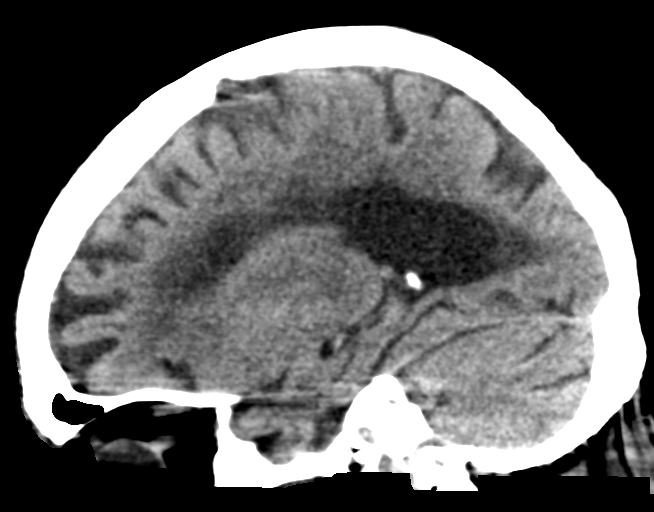
[im 26/52  brain]
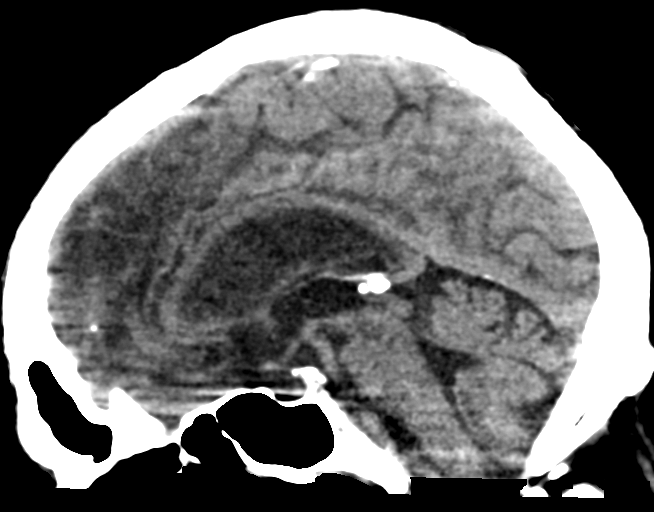
[im 35/52  brain]
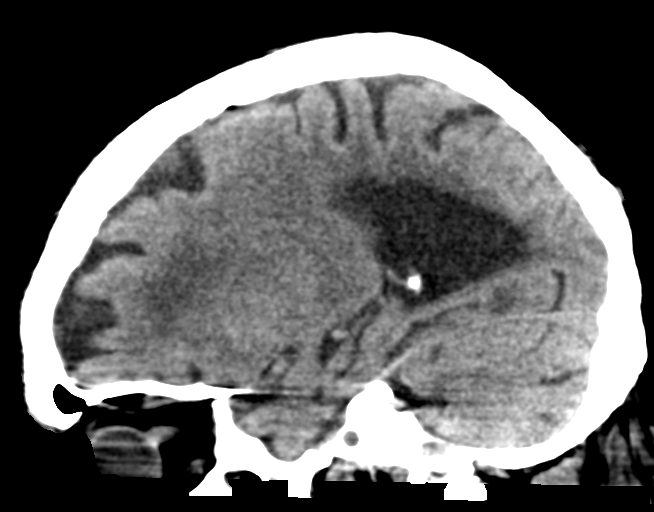

[16 of 47 positions shown; findings below may reference images not displayed]

FINDINGS: Brain: No evidence for acute infarction, hemorrhage, mass lesion,
hydrocephalus, or extra-axial fluid. Generalized atrophy.
Hypoattenuation of white matter, likely small vessel disease.
BILATERAL deep nuclei lacunar infarcts, appear chronic.

Vascular: Calcification of the cavernous internal carotid arteries
consistent with cerebrovascular atherosclerotic disease. No signs of
intracranial large vessel occlusion.

Skull: Normal. Negative for fracture or focal lesion.

Sinuses/Orbits: No acute finding.

Other: None.
IMPRESSION: Atrophy with suspected chronic microvascular ischemic change, and
remote lacunar infarcts. No acute intracranial findings.

## 2019-06-21 DEATH — deceased
